# Patient Record
Sex: Male | Born: 1950
Health system: Southern US, Community
[De-identification: ages and names within clinical notes are randomized; demographics above are authoritative.]

## PROBLEM LIST (undated history)

## (undated) DIAGNOSIS — Z720 Tobacco use: Secondary | ICD-10-CM

## (undated) DIAGNOSIS — M1990 Unspecified osteoarthritis, unspecified site: Secondary | ICD-10-CM

## (undated) DIAGNOSIS — K4090 Unilateral inguinal hernia, without obstruction or gangrene, not specified as recurrent: Secondary | ICD-10-CM

## (undated) DIAGNOSIS — K635 Polyp of colon: Secondary | ICD-10-CM

## (undated) DIAGNOSIS — Z6825 Body mass index (BMI) 25.0-25.9, adult: Secondary | ICD-10-CM

## (undated) DIAGNOSIS — F419 Anxiety disorder, unspecified: Secondary | ICD-10-CM

## (undated) DIAGNOSIS — R319 Hematuria, unspecified: Secondary | ICD-10-CM

## (undated) DIAGNOSIS — M51369 Other intervertebral disc degeneration, lumbar region without mention of lumbar back pain or lower extremity pain: Secondary | ICD-10-CM

## (undated) DIAGNOSIS — K529 Noninfective gastroenteritis and colitis, unspecified: Secondary | ICD-10-CM

## (undated) DIAGNOSIS — C679 Malignant neoplasm of bladder, unspecified: Secondary | ICD-10-CM

## (undated) DIAGNOSIS — M545 Low back pain, unspecified: Secondary | ICD-10-CM

## (undated) DIAGNOSIS — M5136 Other intervertebral disc degeneration, lumbar region: Secondary | ICD-10-CM

## (undated) DIAGNOSIS — J309 Allergic rhinitis, unspecified: Secondary | ICD-10-CM

## (undated) HISTORY — DX: Noninfective gastroenteritis and colitis, unspecified: K52.9

## (undated) HISTORY — DX: Low back pain, unspecified: M54.50

## (undated) HISTORY — DX: Tobacco use: Z72.0

## (undated) HISTORY — DX: Anxiety disorder, unspecified: F41.9

## (undated) HISTORY — DX: Body mass index (BMI) 25.0-25.9, adult: Z68.25

## (undated) HISTORY — PX: SPLENIC ARTERY EMBOLIZATION: SHX2430

## (undated) HISTORY — PX: OTHER SURGICAL HISTORY: SHX169

## (undated) HISTORY — DX: Other intervertebral disc degeneration, lumbar region: M51.36

## (undated) HISTORY — DX: Other intervertebral disc degeneration, lumbar region without mention of lumbar back pain or lower extremity pain: M51.369

## (undated) HISTORY — DX: Malignant neoplasm of bladder, unspecified: C67.9

## (undated) HISTORY — DX: Unspecified osteoarthritis, unspecified site: M19.90

## (undated) HISTORY — DX: Allergic rhinitis, unspecified: J30.9

## (undated) HISTORY — DX: Unilateral inguinal hernia, without obstruction or gangrene, not specified as recurrent: K40.90

## (undated) HISTORY — PX: HERNIA REPAIR: SHX51

## (undated) HISTORY — DX: Hematuria, unspecified: R31.9

## (undated) HISTORY — DX: Polyp of colon: K63.5

## (undated) HISTORY — DX: Low back pain: M54.5

---

## 2003-08-18 ENCOUNTER — Encounter: Payer: Self-pay | Admitting: Neurology

## 2003-08-18 ENCOUNTER — Encounter: Admission: RE | Admit: 2003-08-18 | Discharge: 2003-08-18 | Payer: Self-pay | Admitting: Neurology

## 2005-11-13 ENCOUNTER — Ambulatory Visit: Payer: Self-pay | Admitting: Psychiatry

## 2005-11-13 ENCOUNTER — Inpatient Hospital Stay (HOSPITAL_COMMUNITY): Admission: RE | Admit: 2005-11-13 | Discharge: 2005-11-20 | Payer: Self-pay | Admitting: Psychiatry

## 2006-02-17 ENCOUNTER — Ambulatory Visit: Payer: Self-pay | Admitting: Psychiatry

## 2006-02-17 ENCOUNTER — Inpatient Hospital Stay (HOSPITAL_COMMUNITY): Admission: EM | Admit: 2006-02-17 | Discharge: 2006-02-18 | Payer: Self-pay | Admitting: Psychiatry

## 2013-01-13 DIAGNOSIS — G894 Chronic pain syndrome: Secondary | ICD-10-CM | POA: Insufficient documentation

## 2013-01-13 HISTORY — DX: Chronic pain syndrome: G89.4

## 2013-01-15 ENCOUNTER — Encounter: Payer: Self-pay | Admitting: Neurology

## 2013-01-15 DIAGNOSIS — G8929 Other chronic pain: Secondary | ICD-10-CM

## 2013-01-15 HISTORY — DX: Other chronic pain: G89.29

## 2013-02-23 ENCOUNTER — Ambulatory Visit: Payer: Self-pay | Admitting: Neurology

## 2013-03-08 ENCOUNTER — Other Ambulatory Visit: Payer: Self-pay | Admitting: Neurology

## 2013-03-08 ENCOUNTER — Other Ambulatory Visit: Payer: Self-pay

## 2013-03-08 MED ORDER — OXYCODONE HCL 15 MG PO TABS: 15.0000 mg | ORAL_TABLET | Freq: Four times a day (QID) | ORAL | Status: DC | PRN

## 2013-03-08 MED ORDER — METHADONE HCL 10 MG PO TABS: ORAL_TABLET | ORAL | Status: DC

## 2013-03-08 NOTE — Telephone Encounter (Signed)
Patient called to request refills on Methadone and Oxycodone.

## 2013-04-05 ENCOUNTER — Other Ambulatory Visit: Payer: Self-pay

## 2013-04-05 MED ORDER — METHADONE HCL 10 MG PO TABS: ORAL_TABLET | ORAL | Status: DC

## 2013-04-05 MED ORDER — OXYCODONE HCL 15 MG PO TABS: 15.0000 mg | ORAL_TABLET | Freq: Four times a day (QID) | ORAL | Status: DC | PRN

## 2013-04-05 NOTE — Telephone Encounter (Signed)
Patient requesting refills on Oxycodone and Methadone.  He would like to pick up Rx's when they are ready.  Call back number [phone] or [phone].

## 2013-05-04 ENCOUNTER — Other Ambulatory Visit: Payer: Self-pay

## 2013-05-04 MED ORDER — METHADONE HCL 10 MG PO TABS: ORAL_TABLET | ORAL | Status: DC

## 2013-05-04 MED ORDER — OXYCODONE HCL 15 MG PO TABS: 15.0000 mg | ORAL_TABLET | Freq: Four times a day (QID) | ORAL | Status: DC | PRN

## 2013-05-04 NOTE — Telephone Encounter (Signed)
Patient called requesting refills on Methadone and Oxycodone.  Call back number [phone] or [phone]

## 2013-05-04 NOTE — Telephone Encounter (Signed)
Pt called back for his prescription to make sure that we received information on his prescription that he would like to pick up today he has a ride but will not have one this afternoon.

## 2013-05-31 ENCOUNTER — Other Ambulatory Visit: Payer: Self-pay

## 2013-05-31 MED ORDER — OXYCODONE HCL 15 MG PO TABS: 15.0000 mg | ORAL_TABLET | Freq: Four times a day (QID) | ORAL | Status: DC | PRN

## 2013-05-31 MED ORDER — METHADONE HCL 10 MG PO TABS: ORAL_TABLET | ORAL | Status: DC

## 2013-05-31 NOTE — Telephone Encounter (Signed)
Patient called, left message requesting refills on Methadone and Oxycodone.  He would like to pick up the Rx's when they are ready.  Call back number [phone] or [phone].

## 2013-06-28 ENCOUNTER — Other Ambulatory Visit: Payer: Self-pay

## 2013-06-28 MED ORDER — OXYCODONE HCL 15 MG PO TABS: 15.0000 mg | ORAL_TABLET | Freq: Four times a day (QID) | ORAL | Status: DC | PRN

## 2013-06-28 MED ORDER — METHADONE HCL 10 MG PO TABS: ORAL_TABLET | ORAL | Status: DC

## 2013-06-28 NOTE — Telephone Encounter (Signed)
Rx's ready for pick up.  I called patient, he will be in to get prescriptions.

## 2013-06-28 NOTE — Telephone Encounter (Signed)
Patient called requesting refills on Methadone and Oxycodone.  He would like to pick up the Rx's when they are ready.  Call back number [phone] or [phone].

## 2013-07-27 ENCOUNTER — Other Ambulatory Visit: Payer: Self-pay

## 2013-07-27 MED ORDER — OXYCODONE HCL 15 MG PO TABS: 15.0000 mg | ORAL_TABLET | Freq: Four times a day (QID) | ORAL | Status: DC | PRN

## 2013-07-27 MED ORDER — METHADONE HCL 10 MG PO TABS: ORAL_TABLET | ORAL | Status: DC

## 2013-07-27 NOTE — Telephone Encounter (Signed)
Patient called requesting a refill on Methadone and Oxycodone.  He would like to pick up the Rx's when they are ready.  Call back number [phone] or [phone].

## 2013-07-27 NOTE — Telephone Encounter (Signed)
Rx's have been signed, and are ready for pick up.  I called the patient.  He is aware.

## 2013-08-23 ENCOUNTER — Other Ambulatory Visit: Payer: Self-pay

## 2013-08-23 MED ORDER — METHADONE HCL 10 MG PO TABS: ORAL_TABLET | ORAL | Status: DC

## 2013-08-23 MED ORDER — OXYCODONE HCL 15 MG PO TABS: 15.0000 mg | ORAL_TABLET | Freq: Four times a day (QID) | ORAL | Status: DC | PRN

## 2013-08-23 NOTE — Telephone Encounter (Signed)
Patient called requesting refills on Methadone and Oxycodone.  He would like to pick up the Rx's when they're ready.  Call back number [phone] or [phone].

## 2013-09-21 ENCOUNTER — Other Ambulatory Visit: Payer: Self-pay | Admitting: Neurology

## 2013-09-21 MED ORDER — METHADONE HCL 10 MG PO TABS: ORAL_TABLET | ORAL | Status: DC

## 2013-09-21 MED ORDER — OXYCODONE HCL 15 MG PO TABS: 15.0000 mg | ORAL_TABLET | Freq: Four times a day (QID) | ORAL | Status: DC | PRN

## 2013-10-19 ENCOUNTER — Other Ambulatory Visit: Payer: Self-pay

## 2013-10-19 MED ORDER — METHADONE HCL 10 MG PO TABS: ORAL_TABLET | ORAL | Status: DC

## 2013-10-19 MED ORDER — OXYCODONE HCL 15 MG PO TABS: 15.0000 mg | ORAL_TABLET | Freq: Four times a day (QID) | ORAL | Status: DC | PRN

## 2013-10-19 NOTE — Telephone Encounter (Signed)
I called [facility] and notified his with that patient's two Rx are available for pick up at front desk.

## 2013-10-19 NOTE — Telephone Encounter (Signed)
Patient called requesting a refill on Methadone and Oxycodone.  He would like to pick up the Rx's when they are ready.  Call back number [phone].

## 2013-11-15 ENCOUNTER — Other Ambulatory Visit: Payer: Self-pay

## 2013-11-15 MED ORDER — METHADONE HCL 10 MG PO TABS: ORAL_TABLET | ORAL | Status: DC

## 2013-11-15 MED ORDER — OXYCODONE HCL 15 MG PO TABS: 15.0000 mg | ORAL_TABLET | Freq: Four times a day (QID) | ORAL | Status: DC | PRN

## 2013-11-15 NOTE — Telephone Encounter (Signed)
Patient called requesting refills on Methadone and Oxycodone.  He would like to pick up the Rx's when they are ready.  Call back number [phone].

## 2013-11-16 NOTE — Telephone Encounter (Signed)
Called patient to inform him that his prescription was ready to be picked up and if he has any other problems, questions or concerns to call the office. Patient verbalized understanding. °

## 2013-12-13 ENCOUNTER — Other Ambulatory Visit: Payer: Self-pay

## 2013-12-13 MED ORDER — OXYCODONE HCL 15 MG PO TABS: 15.0000 mg | ORAL_TABLET | Freq: Four times a day (QID) | ORAL | Status: DC | PRN

## 2013-12-13 MED ORDER — METHADONE HCL 10 MG PO TABS: ORAL_TABLET | ORAL | Status: DC

## 2013-12-13 NOTE — Telephone Encounter (Signed)
Patient called requesting a refill on Methadone and Oxycodone.  He would lile to pick up the Rx's when they are ready.  Call back number [phone]

## 2013-12-13 NOTE — Telephone Encounter (Signed)
I called and let patient know both Rx's are ready for pick up.

## 2014-01-10 ENCOUNTER — Other Ambulatory Visit: Payer: Self-pay | Admitting: Neurology

## 2014-01-10 MED ORDER — OXYCODONE HCL 15 MG PO TABS: 15.0000 mg | ORAL_TABLET | Freq: Four times a day (QID) | ORAL | Status: DC | PRN

## 2014-01-10 MED ORDER — METHADONE HCL 10 MG PO TABS: ORAL_TABLET | ORAL | Status: DC

## 2014-01-10 NOTE — Telephone Encounter (Signed)
Needs Methidone and oxycodone for pickup. Wants to know if he can pick it up today pending the bad weather for tomorrow.

## 2014-01-10 NOTE — Telephone Encounter (Signed)
Called patient to inform him that his Rx was ready to be picked up at the front desk and if he has any other problems, questions or concerns to call the office. Patient verbalized understanding.

## 2014-02-07 ENCOUNTER — Telehealth: Payer: Self-pay | Admitting: Neurology

## 2014-02-07 MED ORDER — METHADONE HCL 10 MG PO TABS: ORAL_TABLET | ORAL | Status: DC

## 2014-02-07 MED ORDER — OXYCODONE HCL 15 MG PO TABS: 15.0000 mg | ORAL_TABLET | Freq: Four times a day (QID) | ORAL | Status: DC | PRN

## 2014-02-07 NOTE — Telephone Encounter (Signed)
Patient needs written Rx's for Methadone 10mg and Oxycodone 15mg--please call patient when ready for pick up--thank you. °

## 2014-02-07 NOTE — Telephone Encounter (Signed)
Patient has appt 03/20

## 2014-02-07 NOTE — Telephone Encounter (Signed)
I'll write the prescriptions for the pain medications.

## 2014-02-08 NOTE — Telephone Encounter (Signed)
Patient was notified that his prescriptions were ready for pickup at the front desk.  Patient was advised to call the office with any questions or concerns.

## 2014-02-11 ENCOUNTER — Encounter: Payer: Self-pay | Admitting: Neurology

## 2014-02-11 ENCOUNTER — Encounter (INDEPENDENT_AMBULATORY_CARE_PROVIDER_SITE_OTHER): Payer: Self-pay

## 2014-02-11 ENCOUNTER — Ambulatory Visit (INDEPENDENT_AMBULATORY_CARE_PROVIDER_SITE_OTHER): Payer: Self-pay | Admitting: Neurology

## 2014-02-11 VITALS — BP 160/91 | HR 87 | Wt 166.0 lb

## 2014-02-11 DIAGNOSIS — M549 Dorsalgia, unspecified: Secondary | ICD-10-CM

## 2014-02-11 DIAGNOSIS — G894 Chronic pain syndrome: Secondary | ICD-10-CM

## 2014-02-11 DIAGNOSIS — G8929 Other chronic pain: Secondary | ICD-10-CM

## 2014-02-11 MED ORDER — FLUOXETINE HCL (PMDD) 10 MG PO TABS: 10.0000 mg | ORAL_TABLET | Freq: Every day | ORAL | Status: DC

## 2014-02-11 NOTE — Patient Instructions (Signed)
Back Pain, Adult Low back pain is very common. About 1 in 5 people have back pain.The cause of low back pain is rarely dangerous. The pain often gets better over time.About half of people with a sudden onset of back pain feel better in just 2 weeks. About 8 in 10 people feel better by 6 weeks.  CAUSES Some common causes of back pain include:  Strain of the muscles or ligaments supporting the spine.  Wear and tear (degeneration) of the spinal discs.  Arthritis.  Direct injury to the back. DIAGNOSIS Most of the time, the direct cause of low back pain is not known.However, back pain can be treated effectively even when the exact cause of the pain is unknown.Answering your caregiver's questions about your overall health and symptoms is one of the most accurate ways to make sure the cause of your pain is not dangerous. If your caregiver needs more information, he or she may order lab work or imaging tests (X-rays or MRIs).However, even if imaging tests show changes in your back, this usually does not require surgery. HOME CARE INSTRUCTIONS For many people, back pain returns.Since low back pain is rarely dangerous, it is often a condition that people can learn to manageon their own.   Remain active. It is stressful on the back to sit or stand in one place. Do not sit, drive, or stand in one place for more than 30 minutes at a time. Take short walks on level surfaces as soon as pain allows.Try to increase the length of time you walk each day.  Do not stay in bed.Resting more than 1 or 2 days can delay your recovery.  Do not avoid exercise or work.Your body is made to move.It is not dangerous to be active, even though your back may hurt.Your back will likely heal faster if you return to being active before your pain is gone.  Pay attention to your body when you bend and lift. Many people have less discomfortwhen lifting if they bend their knees, keep the load close to their bodies,and  avoid twisting. Often, the most comfortable positions are those that put less stress on your recovering back.  Find a comfortable position to sleep. Use a firm mattress and lie on your side with your knees slightly bent. If you lie on your back, put a pillow under your knees.  Only take over-the-counter or prescription medicines as directed by your caregiver. Over-the-counter medicines to reduce pain and inflammation are often the most helpful.Your caregiver may prescribe muscle relaxant drugs.These medicines help dull your pain so you can more quickly return to your normal activities and healthy exercise.  Put ice on the injured area.  Put ice in a plastic bag.  Place a towel between your skin and the bag.  Leave the ice on for 15-20 minutes, 03-04 times a day for the first 2 to 3 days. After that, ice and heat may be alternated to reduce pain and spasms.  Ask your caregiver about trying back exercises and gentle massage. This may be of some benefit.  Avoid feeling anxious or stressed.Stress increases muscle tension and can worsen back pain.It is important to recognize when you are anxious or stressed and learn ways to manage it.Exercise is a great option. SEEK MEDICAL CARE IF:  You have pain that is not relieved with rest or medicine.  You have pain that does not improve in 1 week.  You have new symptoms.  You are generally not feeling well. SEEK   IMMEDIATE MEDICAL CARE IF:   You have pain that radiates from your back into your legs.  You develop new bowel or bladder control problems.  You have unusual weakness or numbness in your arms or legs.  You develop nausea or vomiting.  You develop abdominal pain.  You feel faint. Document Released: 11/11/2005 Document Revised: 05/12/2012 Document Reviewed: 04/01/2011 [employer] Patient Information 2014 [employer].  

## 2014-02-11 NOTE — Progress Notes (Signed)
Reason for visit: Low back pain  Randy Chase is an 63 y.o. male  History of present illness:  Randy Chase is a 63 year old right-handed white male with a history of chronic low back pain. The patient indicates that he has not been able to hold a job, and any physical activity significantly exacerbates his low back pain. The patient is having some issues with anxiety and panic attacks. The patient will use some of his wife's diazepam at times. The patient is considering retiring this summer when he turns 21. The patient comes to this office for further evaluation. The patient remains on methadone and oxycodone. The patient takes this regularly. The patient has a history of bladder cancer in the past, and he recently had some hematuria.  Past Medical History  Diagnosis Date  . Low back pain   . Colitis   . Arthritis   . Bladder cancer   . Anxiety     Past Surgical History  Procedure Laterality Date  . Bladder cancer    . Splenic artery embolization      Splenic resection  . Hernia repair      Right inguinal    Family History  Problem Relation Age of Onset  . Colon cancer Mother     Social history:  reports that he has been smoking Cigarettes.  He has been smoking about 0.00 packs per day. He has never used smokeless tobacco. He reports that he drinks about 4.0 ounces of alcohol per week. He reports that he does not use illicit drugs.    Allergies  Allergen Reactions  . Reglan [Metoclopramide]     Medications:  Current Outpatient Prescriptions on File Prior to Visit  Medication Sig Dispense Refill  . ibuprofen (ADVIL,MOTRIN) 200 MG tablet Take 200 mg by mouth 2 (two) times daily as needed for pain.      Marland Kitchen loratadine (CLARITIN) 10 MG tablet Take 10 mg by mouth daily.      . methadone (DOLOPHINE) 10 MG tablet Take 2 tablets in the morning and 3 tablets in the evening  150 tablet  0  . oxyCODONE (ROXICODONE) 15 MG immediate release tablet Take 1 tablet (15 mg total) by  mouth every 6 (six) hours as needed.  120 tablet  0   No current facility-administered medications on file prior to visit.    ROS:  Out of a complete 14 system review of symptoms, the patient complains only of the following symptoms, and all other reviewed systems are negative.  Fatigue Loss of vision Shortness of breath Cold intolerance Frequent waking Environmental allergies Blood in the urine Joint pain, joint swelling, back pain, coordination problems  Bruising easily Memory loss Depression, anxiety  Blood pressure 160/91, pulse 87, weight 166 lb (75.297 kg).  Physical Exam  General: The patient is alert and cooperative at the time of the examination.  Skin: No significant peripheral edema is noted.   Neurologic Exam  Mental status: The patient is oriented x 3.  Cranial nerves: Facial symmetry is present. Speech is normal, no aphasia or dysarthria is noted. Extraocular movements are full. Visual fields are full.  Motor: The patient has good strength in all 4 extremities.  Sensory examination: Soft touch sensation on the face, arms, and legs is symmetric.  Coordination: The patient has good finger-nose-finger and heel-to-shin bilaterally.  Gait and station: The patient has a normal gait. Tandem gait is normal. Romberg is negative. No drift is seen.  Reflexes: Deep tendon reflexes are  symmetric.   Assessment/Plan:  1. Chronic low back pain  2. Anxiety, panic disorder  The patient will be started on Prozac today. The patient will contact me if he is still having issues, and the dose can be increased. The patient will continue on his opiate medications. The patient will followup in one year.  Jill Alexanders MD 02/11/2014 6:41 PM  [facility] [address] [place], [place] [address]  Phone [phone] Fax [phone]

## 2014-03-07 ENCOUNTER — Other Ambulatory Visit: Payer: Self-pay | Admitting: Neurology

## 2014-03-07 MED ORDER — OXYCODONE HCL 15 MG PO TABS: 15.0000 mg | ORAL_TABLET | Freq: Four times a day (QID) | ORAL | Status: DC | PRN

## 2014-03-07 MED ORDER — METHADONE HCL 10 MG PO TABS: ORAL_TABLET | ORAL | Status: DC

## 2014-03-07 NOTE — Telephone Encounter (Signed)
Left a message that the prescriptions are ready for pickup at the front desk.

## 2014-03-07 NOTE — Telephone Encounter (Signed)
Patient needs written Rx for Methadone and Oxycodone-please call patient when ready for pickup--thank you.

## 2014-04-04 ENCOUNTER — Other Ambulatory Visit: Payer: Self-pay | Admitting: *Deleted

## 2014-04-04 MED ORDER — METHADONE HCL 10 MG PO TABS: ORAL_TABLET | ORAL | Status: DC

## 2014-04-04 MED ORDER — OXYCODONE HCL 15 MG PO TABS: 15.0000 mg | ORAL_TABLET | Freq: Four times a day (QID) | ORAL | Status: DC | PRN

## 2014-04-04 NOTE — Telephone Encounter (Signed)
Request forwarded to the provider for approval.

## 2014-04-04 NOTE — Telephone Encounter (Signed)
Patient calling needing refills for methadone (DOLOPHINE) 10 MG tablet and oxyCODONE (ROXICODONE) 15 MG immediate release tablet.  Please call when ready for pickup.  Thanks

## 2014-04-04 NOTE — Telephone Encounter (Signed)
Patient was notified that his prescriptions are ready for pick-up at the front desk.   Patient verbalized understanding.

## 2014-05-02 ENCOUNTER — Telehealth: Payer: Self-pay | Admitting: Neurology

## 2014-05-02 MED ORDER — METHADONE HCL 10 MG PO TABS: ORAL_TABLET | ORAL | Status: DC

## 2014-05-02 MED ORDER — OXYCODONE HCL 15 MG PO TABS: 15.0000 mg | ORAL_TABLET | Freq: Four times a day (QID) | ORAL | Status: DC | PRN

## 2014-05-02 NOTE — Telephone Encounter (Signed)
Patient needs written Rx's for Methadone 10mg  and Oxycodone 15mg --please call patient when ready for pick up--thank you.

## 2014-05-02 NOTE — Telephone Encounter (Signed)
Pt requesting refills on Methadone and oxycodone. Please advise

## 2014-05-02 NOTE — Telephone Encounter (Signed)
Refills will be given for the methadone and oxycodone.

## 2014-05-03 NOTE — Telephone Encounter (Signed)
Called pt to inform him that his Rx was ready to be picked up at the front desk and if he has any other problems, questions or concerns to call the office. Pt verbalized understanding. °

## 2014-05-30 ENCOUNTER — Other Ambulatory Visit: Payer: Self-pay | Admitting: Neurology

## 2014-05-30 MED ORDER — OXYCODONE HCL 15 MG PO TABS: 15.0000 mg | ORAL_TABLET | Freq: Four times a day (QID) | ORAL | Status: DC | PRN

## 2014-05-30 MED ORDER — METHADONE HCL 10 MG PO TABS: ORAL_TABLET | ORAL | Status: DC

## 2014-05-30 NOTE — Telephone Encounter (Signed)
Patient calling to get a written Rx for Methadone 10mg  and Oxycodone 15mg --please call patient when ready for pickup--thank you.

## 2014-05-30 NOTE — Telephone Encounter (Signed)
Dr Willis is out of the office.  Forwarding request to [facility] for approval.  

## 2014-05-31 ENCOUNTER — Other Ambulatory Visit: Payer: Self-pay | Admitting: Neurology

## 2014-05-31 MED ORDER — METHADONE HCL 10 MG PO TABS: ORAL_TABLET | ORAL | Status: DC

## 2014-05-31 MED ORDER — OXYCODONE HCL 15 MG PO TABS: 15.0000 mg | ORAL_TABLET | Freq: Four times a day (QID) | ORAL | Status: DC | PRN

## 2014-05-31 NOTE — Telephone Encounter (Signed)
Pt came by the office to picked up Rx from the front desk today.

## 2014-05-31 NOTE — Telephone Encounter (Signed)
Patient requesting refill of methadone and oxycodone, states that he only has one left for tonight.

## 2014-06-27 ENCOUNTER — Telehealth: Payer: Self-pay | Admitting: Neurology

## 2014-06-27 NOTE — Telephone Encounter (Signed)
Rx's were last written on 07/08 for a 30 day supply on each.  I called the patient back.  Got no answer, left message.

## 2014-06-27 NOTE — Telephone Encounter (Signed)
Calling for refills on methadone (DOLOPHINE) 10 MG tablet and oxyCODONE (ROXICODONE) 15 MG immediate release tablet please call when it is ready for pickup

## 2014-06-28 ENCOUNTER — Other Ambulatory Visit: Payer: Self-pay

## 2014-06-28 MED ORDER — OXYCODONE HCL 15 MG PO TABS: 15.0000 mg | ORAL_TABLET | Freq: Four times a day (QID) | ORAL | Status: DC | PRN

## 2014-06-28 MED ORDER — METHADONE HCL 10 MG PO TABS: ORAL_TABLET | ORAL | Status: DC

## 2014-06-28 NOTE — Telephone Encounter (Signed)
Pt stopped by the office to pick up his Rx. °

## 2014-06-28 NOTE — Telephone Encounter (Signed)
Patient calling to follow up on the status of these refills, patient requesting a call back, please reach him at [phone].

## 2014-07-25 ENCOUNTER — Telehealth: Payer: Self-pay | Admitting: Neurology

## 2014-07-25 MED ORDER — OXYCODONE HCL 15 MG PO TABS: 15.0000 mg | ORAL_TABLET | Freq: Four times a day (QID) | ORAL | Status: DC | PRN

## 2014-07-25 MED ORDER — METHADONE HCL 10 MG PO TABS: ORAL_TABLET | ORAL | Status: DC

## 2014-07-25 NOTE — Telephone Encounter (Signed)
I will refill the medications. 

## 2014-07-25 NOTE — Telephone Encounter (Signed)
Patient calling for refills on Methadone 10mg  and Oxycodone 15mg  immediate release.

## 2014-07-26 NOTE — Telephone Encounter (Signed)
Called pt to inform him that his Rx was ready to be picked up at the front desk and if he has any other problems, questions or concerns to call the office. Pt verbalized understanding. °

## 2014-08-22 ENCOUNTER — Other Ambulatory Visit: Payer: Self-pay | Admitting: Neurology

## 2014-08-22 MED ORDER — METHADONE HCL 10 MG PO TABS: ORAL_TABLET | ORAL | Status: DC

## 2014-08-22 MED ORDER — OXYCODONE HCL 15 MG PO TABS: 15.0000 mg | ORAL_TABLET | Freq: Four times a day (QID) | ORAL | Status: DC | PRN

## 2014-08-22 NOTE — Telephone Encounter (Signed)
Patient requesting refill of methadone and oxycodone, please call and advise.

## 2014-08-22 NOTE — Telephone Encounter (Signed)
Dr Willis is out of the office, forwarding request to [facility] for review.   

## 2014-08-23 NOTE — Telephone Encounter (Signed)
Called pt to inform him that his Rx was ready to be picked up at the front desk and if he has any other problems, questions or concerns to call the office. Pt verbalized understanding. °

## 2014-09-19 ENCOUNTER — Other Ambulatory Visit: Payer: Self-pay | Admitting: Neurology

## 2014-09-19 MED ORDER — METHADONE HCL 10 MG PO TABS: ORAL_TABLET | ORAL | Status: DC

## 2014-09-19 MED ORDER — OXYCODONE HCL 15 MG PO TABS: 15.0000 mg | ORAL_TABLET | Freq: Four times a day (QID) | ORAL | Status: DC | PRN

## 2014-09-19 NOTE — Telephone Encounter (Signed)
Patient requesting refill of methadone and oxycodone, please call when ready for pick up.

## 2014-09-19 NOTE — Telephone Encounter (Signed)
Dr Jannifer Franklin is out of the office.  Request entered, forwarded to [facility] for review.  Last written 09/28.

## 2014-09-20 NOTE — Telephone Encounter (Signed)
Patient calling to check on the status of his refill, states that today is the only day he can come and pick it up, please return call and advise.

## 2014-09-20 NOTE — Telephone Encounter (Signed)
I called the patient back.  He is aware we will call him when Rx's are ready.

## 2014-09-22 NOTE — Telephone Encounter (Signed)
Patient picked up Rx on 09/20/14

## 2014-10-12 ENCOUNTER — Encounter: Payer: Self-pay | Admitting: Neurology

## 2014-10-17 ENCOUNTER — Other Ambulatory Visit: Payer: Self-pay | Admitting: Neurology

## 2014-10-17 MED ORDER — METHADONE HCL 10 MG PO TABS: ORAL_TABLET | ORAL | Status: DC

## 2014-10-17 MED ORDER — OXYCODONE HCL 15 MG PO TABS: 15.0000 mg | ORAL_TABLET | Freq: Four times a day (QID) | ORAL | Status: DC | PRN

## 2014-10-17 NOTE — Telephone Encounter (Signed)
Pt is calling to request a written Rx for  methadone (DOLOPHINE) 10 MG tablet and oxyCODONE (ROXICODONE) 15 MG immediate release tablet. Please call when ready to pick up.

## 2014-10-17 NOTE — Telephone Encounter (Signed)
Request entered, forwarded to provider for approval.  

## 2014-10-18 ENCOUNTER — Encounter: Payer: Self-pay | Admitting: Neurology

## 2014-10-18 NOTE — Telephone Encounter (Signed)
Patient calling to state that he would like to pick up his script today since today is the only time he can come and get it, please call and advise.

## 2014-10-18 NOTE — Telephone Encounter (Signed)
Patient came to the office to see if Rx was ready. I took signed Rx to the front desk

## 2014-11-14 ENCOUNTER — Other Ambulatory Visit: Payer: Self-pay | Admitting: Neurology

## 2014-11-14 NOTE — Telephone Encounter (Signed)
Patient requesting Rx refill for oxyCODONE (ROXICODONE) 15 MG immediate release tablet and methadone (DOLOPHINE) 10 MG tablet.  Please call when ready for pick up.

## 2014-11-14 NOTE — Telephone Encounter (Signed)
Dr Jannifer Franklin is out of the office.  Request entered, forwarded to [facility] for approval.

## 2014-11-15 ENCOUNTER — Telehealth: Payer: Self-pay | Admitting: Neurology

## 2014-11-15 MED ORDER — OXYCODONE HCL 15 MG PO TABS: 15.0000 mg | ORAL_TABLET | Freq: Four times a day (QID) | ORAL | Status: DC | PRN

## 2014-11-15 MED ORDER — METHADONE HCL 10 MG PO TABS: ORAL_TABLET | ORAL | Status: DC

## 2014-11-15 NOTE — Telephone Encounter (Signed)
Patient came to pick up medications for methadone and oxycodone.

## 2014-12-12 ENCOUNTER — Telehealth: Payer: Self-pay

## 2014-12-12 ENCOUNTER — Other Ambulatory Visit: Payer: Self-pay | Admitting: Neurology

## 2014-12-12 MED ORDER — OXYCODONE HCL 15 MG PO TABS: 15.0000 mg | ORAL_TABLET | Freq: Four times a day (QID) | ORAL | Status: DC | PRN

## 2014-12-12 MED ORDER — METHADONE HCL 10 MG PO TABS: ORAL_TABLET | ORAL | Status: DC

## 2014-12-12 NOTE — Telephone Encounter (Signed)
Request entered, forwarded to provider for approval.  

## 2014-12-12 NOTE — Telephone Encounter (Signed)
Patient is calling for written Rx for Methadone 10 mg and Oxycodone 15 mg.  Please call.

## 2014-12-12 NOTE — Telephone Encounter (Signed)
Called patient and informed Rx ready for pick up at front desk. Patient verbalized understanding.  

## 2015-01-10 ENCOUNTER — Telehealth: Payer: Self-pay

## 2015-01-10 ENCOUNTER — Other Ambulatory Visit: Payer: Self-pay | Admitting: Neurology

## 2015-01-10 MED ORDER — METHADONE HCL 10 MG PO TABS: ORAL_TABLET | ORAL | Status: DC

## 2015-01-10 MED ORDER — OXYCODONE HCL 15 MG PO TABS: 15.0000 mg | ORAL_TABLET | Freq: Four times a day (QID) | ORAL | Status: DC | PRN

## 2015-01-10 NOTE — Telephone Encounter (Signed)
Called patient and informed Rx ready for pick up at front desk. Patient verbalized understanding.  

## 2015-01-10 NOTE — Telephone Encounter (Signed)
Request entered, forwarded to provider for approval.  

## 2015-01-10 NOTE — Telephone Encounter (Signed)
Pt is calling to get written Rx for methadone (DOLOPHINE) 10 MG tablet and oxyCODONE (ROXICODONE) 15 MG immediate release tablet. Please call when ready for pick up.

## 2015-02-06 ENCOUNTER — Telehealth: Payer: Self-pay

## 2015-02-06 ENCOUNTER — Other Ambulatory Visit: Payer: Self-pay | Admitting: Neurology

## 2015-02-06 MED ORDER — METHADONE HCL 10 MG PO TABS
ORAL_TABLET | ORAL | Status: DC
Start: 2015-02-06 — End: 2015-03-06

## 2015-02-06 MED ORDER — OXYCODONE HCL 15 MG PO TABS: 15.0000 mg | ORAL_TABLET | Freq: Four times a day (QID) | ORAL | Status: DC | PRN

## 2015-02-06 NOTE — Telephone Encounter (Signed)
Request entered, forwarded to provider for approval.    Patient has annual appt scheduled.

## 2015-02-06 NOTE — Telephone Encounter (Signed)
Called patient and informed Rx ready for pick up at front desk. Patient verbalized understanding.  

## 2015-02-06 NOTE — Telephone Encounter (Signed)
Patient is calling to get written Rx for Methadone 10 mg and Oxycodone 15 mg.  Please call when ready.  Thanks!

## 2015-02-13 ENCOUNTER — Ambulatory Visit (INDEPENDENT_AMBULATORY_CARE_PROVIDER_SITE_OTHER): Payer: Self-pay | Admitting: Neurology

## 2015-02-13 ENCOUNTER — Encounter: Payer: Self-pay | Admitting: Neurology

## 2015-02-13 VITALS — BP 164/91 | HR 81 | Ht 69.0 in | Wt 164.6 lb

## 2015-02-13 DIAGNOSIS — M549 Dorsalgia, unspecified: Secondary | ICD-10-CM

## 2015-02-13 DIAGNOSIS — G8929 Other chronic pain: Secondary | ICD-10-CM

## 2015-02-13 DIAGNOSIS — G894 Chronic pain syndrome: Secondary | ICD-10-CM

## 2015-02-13 MED ORDER — FLUOXETINE HCL (PMDD) 10 MG PO TABS: 10.0000 mg | ORAL_TABLET | Freq: Every day | ORAL | Status: DC

## 2015-02-13 NOTE — Patient Instructions (Signed)
Back Pain, Adult Low back pain is very common. About 1 in 5 people have back pain.The cause of low back pain is rarely dangerous. The pain often gets better over time.About half of people with a sudden onset of back pain feel better in just 2 weeks. About 8 in 10 people feel better by 6 weeks.  CAUSES Some common causes of back pain include:  Strain of the muscles or ligaments supporting the spine.  Wear and tear (degeneration) of the spinal discs.  Arthritis.  Direct injury to the back. DIAGNOSIS Most of the time, the direct cause of low back pain is not known.However, back pain can be treated effectively even when the exact cause of the pain is unknown.Answering your caregiver's questions about your overall health and symptoms is one of the most accurate ways to make sure the cause of your pain is not dangerous. If your caregiver needs more information, he or she may order lab work or imaging tests (X-rays or MRIs).However, even if imaging tests show changes in your back, this usually does not require surgery. HOME CARE INSTRUCTIONS For many people, back pain returns.Since low back pain is rarely dangerous, it is often a condition that people can learn to manageon their own.   Remain active. It is stressful on the back to sit or stand in one place. Do not sit, drive, or stand in one place for more than 30 minutes at a time. Take short walks on level surfaces as soon as pain allows.Try to increase the length of time you walk each day.  Do not stay in bed.Resting more than 1 or 2 days can delay your recovery.  Do not avoid exercise or work.Your body is made to move.It is not dangerous to be active, even though your back may hurt.Your back will likely heal faster if you return to being active before your pain is gone.  Pay attention to your body when you bend and lift. Many people have less discomfortwhen lifting if they bend their knees, keep the load close to their bodies,and  avoid twisting. Often, the most comfortable positions are those that put less stress on your recovering back.  Find a comfortable position to sleep. Use a firm mattress and lie on your side with your knees slightly bent. If you lie on your back, put a pillow under your knees.  Only take over-the-counter or prescription medicines as directed by your caregiver. Over-the-counter medicines to reduce pain and inflammation are often the most helpful.Your caregiver may prescribe muscle relaxant drugs.These medicines help dull your pain so you can more quickly return to your normal activities and healthy exercise.  Put ice on the injured area.  Put ice in a plastic bag.  Place a towel between your skin and the bag.  Leave the ice on for 15-20 minutes, 03-04 times a day for the first 2 to 3 days. After that, ice and heat may be alternated to reduce pain and spasms.  Ask your caregiver about trying back exercises and gentle massage. This may be of some benefit.  Avoid feeling anxious or stressed.Stress increases muscle tension and can worsen back pain.It is important to recognize when you are anxious or stressed and learn ways to manage it.Exercise is a great option. SEEK MEDICAL CARE IF:  You have pain that is not relieved with rest or medicine.  You have pain that does not improve in 1 week.  You have new symptoms.  You are generally not feeling well. SEEK   IMMEDIATE MEDICAL CARE IF:   You have pain that radiates from your back into your legs.  You develop new bowel or bladder control problems.  You have unusual weakness or numbness in your arms or legs.  You develop nausea or vomiting.  You develop abdominal pain.  You feel faint. Document Released: 11/11/2005 Document Revised: 05/12/2012 Document Reviewed: 03/15/2014 [employer] Patient Information 2015 [employer]. This information is not intended to replace advice given to you by your health care provider. Make sure you  discuss any questions you have with your health care provider.  

## 2015-02-13 NOTE — Progress Notes (Signed)
Reason for visit: Chronic pain syndrome  Randy Chase is an 64 y.o. male  History of present illness:  Randy Chase is a 64 year old right-handed white male with a history of chronic back pain and generalized arthritic pains. The patient indicates that his pain generally will increase with cold weather. He has some swelling and pain in the right foot from a prior injury to the foot. He remains on methadone on a regular basis. He has not yet gotten his prescription for Prozac. He believes that the medication does help him. He returns for an evaluation. He denies any new medical issues that have come up since last seen. He is under increased stress as his daughter and her son have moved into their home.  Past Medical History  Diagnosis Date  . Low back pain   . Colitis   . Arthritis   . Bladder cancer   . Anxiety     Past Surgical History  Procedure Laterality Date  . Bladder cancer    . Splenic artery embolization      Splenic resection  . Hernia repair      Right inguinal    Family History  Problem Relation Age of Onset  . Colon cancer Mother     Social history:  reports that he has been smoking Cigarettes.  He has never used smokeless tobacco. He reports that he drinks about 16.8 oz of alcohol per week. He reports that he does not use illicit drugs.    Allergies  Allergen Reactions  . Reglan [Metoclopramide]     Medications:  Prior to Admission medications   Medication Sig Start Date End Date Taking? Authorizing Provider  ibuprofen (ADVIL,MOTRIN) 200 MG tablet Take 200 mg by mouth 2 (two) times daily as needed for pain.   Yes Historical Provider, MD  loratadine (CLARITIN) 10 MG tablet Take 10 mg by mouth daily.   Yes Historical Provider, MD  methadone (DOLOPHINE) 10 MG tablet Take 2 tablets in the morning and 3 tablets in the evening 02/06/15  Yes Kathrynn Ducking, MD  oxyCODONE (ROXICODONE) 15 MG immediate release tablet Take 1 tablet (15 mg total) by mouth every 6  (six) hours as needed. 02/06/15  Yes Kathrynn Ducking, MD  Fluoxetine HCl, PMDD, 10 MG TABS Take 1 tablet (10 mg total) by mouth daily. 02/13/15   Kathrynn Ducking, MD    ROS:  Out of a complete 14 system review of symptoms, the patient complains only of the following symptoms, and all other reviewed systems are negative.  Shortness of breath Frequency of urination Joint pain, back pain Bruising easily Depression, anxiety  Blood pressure 164/91, pulse 81, height 5\' 9"  (1.753 m), weight 164 lb 9.6 oz (74.662 kg).  Physical Exam  General: The patient is alert and cooperative at the time of the examination.  Skin: No significant peripheral edema is noted.   Neurologic Exam  Mental status: The patient is alert and oriented x 3 at the time of the examination. The patient has apparent normal recent and remote memory, with an apparently normal attention span and concentration ability.   Cranial nerves: Facial symmetry is present. Speech is normal, no aphasia or dysarthria is noted. Extraocular movements are full. Visual fields are full.  Motor: The patient has good strength in all 4 extremities.  Sensory examination: Soft touch sensation is symmetric on the face, arms, and legs.  Coordination: The patient has good finger-nose-finger and heel-to-shin bilaterally.  Gait and station: The  patient has a normal gait. Tandem gait is normal. Romberg is negative. No drift is seen.  Reflexes: Deep tendon reflexes are symmetric.   Assessment/Plan:  1. Chronic pain syndrome, chronic back pain  2. Anxiety and depression  The patient was given a prescription for Prozac today. He has signed a narcotics agreement. We will continue the methadone for now, he will follow-up in 6 months.  Jill Alexanders MD 02/13/2015 6:58 PM  [facility] [address] [place], [place] [address]  Phone [phone] Fax [phone]

## 2015-03-06 ENCOUNTER — Other Ambulatory Visit: Payer: Self-pay | Admitting: Neurology

## 2015-03-06 ENCOUNTER — Telehealth: Payer: Self-pay

## 2015-03-06 MED ORDER — OXYCODONE HCL 15 MG PO TABS: 15.0000 mg | ORAL_TABLET | Freq: Four times a day (QID) | ORAL | Status: DC | PRN

## 2015-03-06 MED ORDER — METHADONE HCL 10 MG PO TABS: ORAL_TABLET | ORAL | Status: DC

## 2015-03-06 NOTE — Telephone Encounter (Signed)
Request entered, forwarded to provider for approval.  

## 2015-03-06 NOTE — Telephone Encounter (Signed)
Pt is calling requesting written Rx for methadone (DOLOPHINE) 10 MG tablet and oxyCODONE (ROXICODONE) 15 MG immediate release tablet. Please call when ready for pick up.

## 2015-03-06 NOTE — Telephone Encounter (Signed)
Called patient and informed Rx ready for pick up at front desk. Patient verbalized understanding.  

## 2015-04-03 ENCOUNTER — Telehealth: Payer: Self-pay

## 2015-04-03 ENCOUNTER — Other Ambulatory Visit: Payer: Self-pay | Admitting: Neurology

## 2015-04-03 MED ORDER — METHADONE HCL 10 MG PO TABS: ORAL_TABLET | ORAL | Status: DC

## 2015-04-03 MED ORDER — OXYCODONE HCL 15 MG PO TABS: 15.0000 mg | ORAL_TABLET | Freq: Four times a day (QID) | ORAL | Status: DC | PRN

## 2015-04-03 NOTE — Telephone Encounter (Signed)
Rx ready for pick up. 

## 2015-04-03 NOTE — Telephone Encounter (Signed)
Request entered, forwarded to provider for approval.  

## 2015-04-03 NOTE — Telephone Encounter (Signed)
Patient is calling for 2 written Rx:  Methadone 10 mg; Oxycodone 15 mg.  Please call when ready.  Thanks!

## 2015-05-01 ENCOUNTER — Telehealth: Payer: Self-pay

## 2015-05-01 ENCOUNTER — Other Ambulatory Visit: Payer: Self-pay | Admitting: Neurology

## 2015-05-01 MED ORDER — METHADONE HCL 10 MG PO TABS: ORAL_TABLET | ORAL | Status: DC

## 2015-05-01 MED ORDER — OXYCODONE HCL 15 MG PO TABS: 15.0000 mg | ORAL_TABLET | Freq: Four times a day (QID) | ORAL | Status: DC | PRN

## 2015-05-01 NOTE — Telephone Encounter (Signed)
Patient calling for written refills for methadone (DOLOPHINE) 10 MG tablet and oxyCODONE (ROXICODONE) 15 MG immediate release tablet. Patient states if possible he would like to pick these up around lunch time today. His wife is having back issues and is gong to [facility] today to see a Dr. Patient can be reached at [phone].

## 2015-05-01 NOTE — Telephone Encounter (Signed)
Rx ready for pick up. 

## 2015-05-01 NOTE — Telephone Encounter (Signed)
Request entered, forwarded to provider for approval.  

## 2015-05-26 ENCOUNTER — Telehealth: Payer: Self-pay | Admitting: Neurology

## 2015-05-26 NOTE — Telephone Encounter (Signed)
Pt called to have medication refill on methadone (DOLOPHINE) 10 MG tablet and oxyCODONE (ROXICODONE) 15 MG immediate release tablet.Patient advised that we are closed on Monday July 4th and Rx may not be ready until Tuesday.  May call [phone]

## 2015-05-30 ENCOUNTER — Other Ambulatory Visit: Payer: Self-pay

## 2015-05-30 DIAGNOSIS — M549 Dorsalgia, unspecified: Principal | ICD-10-CM

## 2015-05-30 DIAGNOSIS — G894 Chronic pain syndrome: Secondary | ICD-10-CM

## 2015-05-30 DIAGNOSIS — G8929 Other chronic pain: Secondary | ICD-10-CM

## 2015-05-30 MED ORDER — OXYCODONE HCL 15 MG PO TABS: 15.0000 mg | ORAL_TABLET | Freq: Four times a day (QID) | ORAL | Status: DC | PRN

## 2015-05-30 MED ORDER — METHADONE HCL 10 MG PO TABS
ORAL_TABLET | ORAL | Status: DC
Start: 2015-05-30 — End: 2015-06-27

## 2015-05-30 NOTE — Telephone Encounter (Signed)
Was this taken care of?

## 2015-05-31 NOTE — Telephone Encounter (Signed)
Yes. I'm sorry. I thought you were the [facility], but it was Dr. Felecia Shelling. He signed them.

## 2015-06-26 ENCOUNTER — Telehealth: Payer: Self-pay | Admitting: Neurology

## 2015-06-26 NOTE — Telephone Encounter (Signed)
Rx's were last written on 07/05.  I called patient back on cell, got no answer, VM has not been set up.  Called home, got no answer, mailbox was full.  Unable to leave message on either line.

## 2015-06-26 NOTE — Telephone Encounter (Signed)
Pt called requesting refills for Methadone and Oxycodone

## 2015-06-27 ENCOUNTER — Other Ambulatory Visit: Payer: Self-pay

## 2015-06-27 ENCOUNTER — Telehealth: Payer: Self-pay

## 2015-06-27 DIAGNOSIS — M549 Dorsalgia, unspecified: Principal | ICD-10-CM

## 2015-06-27 DIAGNOSIS — G8929 Other chronic pain: Secondary | ICD-10-CM

## 2015-06-27 DIAGNOSIS — G894 Chronic pain syndrome: Secondary | ICD-10-CM

## 2015-06-27 MED ORDER — OXYCODONE HCL 15 MG PO TABS
15.0000 mg | ORAL_TABLET | Freq: Four times a day (QID) | ORAL | Status: DC | PRN
Start: 2015-06-27 — End: 2015-07-25

## 2015-06-27 MED ORDER — METHADONE HCL 10 MG PO TABS: ORAL_TABLET | ORAL | Status: DC

## 2015-06-27 NOTE — Telephone Encounter (Signed)
Rx ready for pick up. 

## 2015-07-25 ENCOUNTER — Telehealth: Payer: Self-pay

## 2015-07-25 ENCOUNTER — Other Ambulatory Visit: Payer: Self-pay | Admitting: Neurology

## 2015-07-25 DIAGNOSIS — M549 Dorsalgia, unspecified: Principal | ICD-10-CM

## 2015-07-25 DIAGNOSIS — G8929 Other chronic pain: Secondary | ICD-10-CM

## 2015-07-25 DIAGNOSIS — G894 Chronic pain syndrome: Secondary | ICD-10-CM

## 2015-07-25 MED ORDER — METHADONE HCL 10 MG PO TABS: ORAL_TABLET | ORAL | Status: DC

## 2015-07-25 MED ORDER — OXYCODONE HCL 15 MG PO TABS: 15.0000 mg | ORAL_TABLET | Freq: Four times a day (QID) | ORAL | Status: DC | PRN

## 2015-07-25 NOTE — Telephone Encounter (Signed)
Today is the 28th day since last Rx.  Request entered, forwarded to provider for review.

## 2015-07-25 NOTE — Telephone Encounter (Signed)
Patient is calling to order written Rx for the following:  Methadone 10 mg tablets; Oxydodone 15 mg immediate release tablets.  Thanks!

## 2015-07-25 NOTE — Telephone Encounter (Signed)
Rx ready for pick up. 

## 2015-08-16 ENCOUNTER — Ambulatory Visit: Payer: Self-pay | Admitting: Adult Health

## 2015-08-22 ENCOUNTER — Telehealth: Payer: Self-pay | Admitting: Neurology

## 2015-08-22 NOTE — Telephone Encounter (Signed)
Patient is calling to order 2 written Rx:  Methadone 10 mg and Oxycodone 15 mg.  The patient is coming in tomorrow morning for an appointment.  Thanks!

## 2015-08-22 NOTE — Telephone Encounter (Signed)
The prescriptions will be written at the time of the appointment.

## 2015-08-23 ENCOUNTER — Encounter: Payer: Self-pay | Admitting: Adult Health

## 2015-08-23 ENCOUNTER — Ambulatory Visit (INDEPENDENT_AMBULATORY_CARE_PROVIDER_SITE_OTHER): Payer: Self-pay | Admitting: Adult Health

## 2015-08-23 VITALS — BP 126/71 | HR 70 | Ht 69.0 in | Wt 154.0 lb

## 2015-08-23 DIAGNOSIS — G8929 Other chronic pain: Secondary | ICD-10-CM

## 2015-08-23 DIAGNOSIS — G894 Chronic pain syndrome: Secondary | ICD-10-CM

## 2015-08-23 DIAGNOSIS — M549 Dorsalgia, unspecified: Secondary | ICD-10-CM

## 2015-08-23 DIAGNOSIS — Z5181 Encounter for therapeutic drug level monitoring: Secondary | ICD-10-CM

## 2015-08-23 DIAGNOSIS — Z79891 Long term (current) use of opiate analgesic: Secondary | ICD-10-CM

## 2015-08-23 MED ORDER — OXYCODONE HCL 15 MG PO TABS: 15.0000 mg | ORAL_TABLET | Freq: Four times a day (QID) | ORAL | Status: DC | PRN

## 2015-08-23 MED ORDER — METHADONE HCL 10 MG PO TABS: ORAL_TABLET | ORAL | Status: DC

## 2015-08-23 NOTE — Patient Instructions (Signed)
Continue Methadone and Oxycodone for pain. We complete drug test today. If your symptoms worsen or you develop new symptoms please let us know.

## 2015-08-23 NOTE — Progress Notes (Signed)
I have read the note, and I agree with the clinical assessment and plan.  WILLIS,CHARLES KEITH   

## 2015-08-23 NOTE — Progress Notes (Signed)
PATIENT: Randy Chase DOB: Aug 09, 1951  REASON FOR VISIT: follow up- chronic back pain and arthritic pains HISTORY FROM: patient  HISTORY OF PRESENT ILLNESS: Randy Chase is a 64 year old white male with a history of chronic back pain and generalized arthritic pains. The patient states that his pain is worse when he is inactive. He states that the hot weather enabled him from being outdoors and being active therefore his discomfort gets slightly worse. He continues to have some numbness in the right foot. He currently takes methadone and oxycodone to control his pain. He states this is working well for him. At the last visit he did sign a narcotic agreement. He denies any new medical issues. He returns today for an evaluation.  HISTORY 02/13/15: Randy Chase is a 64 year old right-handed white male with a history of chronic back pain and generalized arthritic pains. The patient indicates that his pain generally will increase with cold weather. He has some swelling and pain in the right foot from a prior injury to the foot. He remains on methadone on a regular basis. He has not yet gotten his prescription for Prozac. He believes that the medication does help him. He returns for an evaluation. He denies any new medical issues that have come up since last seen. He is under increased stress as his daughter and her son have moved into their home.  REVIEW OF SYSTEMS: Out of a complete 14 system review of symptoms, the patient complains only of the following symptoms, and all other reviewed systems are negative.  Loss of vision, abdominal pain, leg swelling, frequent waking, back pain, joint pain, frequency of urination, frequent infections, bruise/bleed easily, numbness, depression, nervous/anxious  ALLERGIES: Allergies  Allergen Reactions  . Reglan [Metoclopramide]     HOME MEDICATIONS: Outpatient Prescriptions Prior to Visit  Medication Sig Dispense Refill  . ibuprofen (ADVIL,MOTRIN) 200 MG tablet  Take 200 mg by mouth 2 (two) times daily as needed for pain.    Marland Kitchen loratadine (CLARITIN) 10 MG tablet Take 10 mg by mouth daily.    . methadone (DOLOPHINE) 10 MG tablet Take 2 tablets in the morning and 3 tablets in the evening 150 tablet 0  . oxyCODONE (ROXICODONE) 15 MG immediate release tablet Take 1 tablet (15 mg total) by mouth every 6 (six) hours as needed. 120 tablet 0  . Fluoxetine HCl, PMDD, 10 MG TABS Take 1 tablet (10 mg total) by mouth daily. (Patient not taking: Reported on 08/23/2015) 30 each 5   No facility-administered medications prior to visit.    PAST MEDICAL HISTORY: Past Medical History  Diagnosis Date  . Low back pain   . Colitis   . Arthritis   . Bladder cancer   . Anxiety     PAST SURGICAL HISTORY: Past Surgical History  Procedure Laterality Date  . Bladder cancer    . Splenic artery embolization      Splenic resection  . Hernia repair      Right inguinal    FAMILY HISTORY: Family History  Problem Relation Age of Onset  . Colon cancer Mother     SOCIAL HISTORY: Social History   Social History  . Marital Status: Married    Spouse Name: N/A  . Number of Children: 1  . Years of Education: 12   Occupational History  . Not on file.   Social History Main Topics  . Smoking status: Current Every Day Smoker    Types: Cigarettes  . Smokeless tobacco: Never Used  .  Alcohol Use: 16.8 oz/week    8 Standard drinks or equivalent, 20 Cans of beer per week  . Drug Use: No  . Sexual Activity: Not on file   Other Topics Concern  . Not on file   Social History Narrative   Patient is right handed.    Patient does not drink caffeine.      PHYSICAL EXAM  Filed Vitals:   08/23/15 1123  BP: 126/71  Pulse: 70  Height: 5\' 9"  (1.753 m)  Weight: 154 lb (69.854 kg)   Body mass index is 22.73 kg/(m^2).  Generalized: Well developed, in no acute distress   Neurological examination  Mentation: Alert oriented to time, place, history taking. Follows  all commands speech and language fluent Cranial nerve II-XII: Pupils were equal round reactive to light. Extraocular movements were full, visual field were full on confrontational test. Facial sensation and strength were normal. Uvula tongue midline. Head turning and shoulder shrug  were normal and symmetric. Motor: The motor testing reveals 5 over 5 strength of all 4 extremities. Good symmetric motor tone is noted throughout.  Sensory: Sensory testing is intact to soft touch on all 4 extremities. No evidence of extinction is noted.  Coordination: Cerebellar testing reveals good finger-nose-finger and heel-to-shin bilaterally.  Gait and station: Gait is normal. Tandem gait is normal. Romberg is negative. No drift is seen.  Reflexes: Deep tendon reflexes are symmetric and normal bilaterally.   DIAGNOSTIC DATA (LABS, IMAGING, TESTING) - I reviewed patient records, labs, notes, testing and imaging myself where available.     ASSESSMENT AND PLAN 64 y.o. year old male  has a past medical history of Low back pain; Colitis; Arthritis; Bladder cancer; and Anxiety. here with:  1. Chronic back pain 2. Generalized arthritic pain  Overall the patient is doing well. Methadone and oxycodone seem to be controlling his pain well. We will check a urine drug screen today. I have provided the patient with a refill of methadone and oxycodone. If his pain worsens or he develops new symptoms. He will let us know. He will return in 6 months or sooner if needed.  Ward Givens, MSN, NP-C 08/23/2015, 11:50 AM [facility] [address], [place], [place] [address] [phone]

## 2015-08-31 LAB — COMPREHENSIVE DRUG ANALYSIS,UR: PDF: 0

## 2015-09-01 ENCOUNTER — Telehealth: Payer: Self-pay

## 2015-09-01 NOTE — Telephone Encounter (Signed)
Called patient.  No answer.

## 2015-09-01 NOTE — Telephone Encounter (Signed)
-----   Message from Ward Givens, NP sent at 08/31/2015  4:44 PM EDT ----- Lab work is ok. Please call the patient.

## 2015-09-04 NOTE — Telephone Encounter (Signed)
Called left detailed message on hp# vmail per DPR.

## 2015-09-19 ENCOUNTER — Telehealth: Payer: Self-pay

## 2015-09-19 ENCOUNTER — Other Ambulatory Visit: Payer: Self-pay | Admitting: Neurology

## 2015-09-19 DIAGNOSIS — G894 Chronic pain syndrome: Secondary | ICD-10-CM

## 2015-09-19 DIAGNOSIS — M549 Dorsalgia, unspecified: Principal | ICD-10-CM

## 2015-09-19 DIAGNOSIS — G8929 Other chronic pain: Secondary | ICD-10-CM

## 2015-09-19 MED ORDER — METHADONE HCL 10 MG PO TABS: ORAL_TABLET | ORAL | Status: DC

## 2015-09-19 MED ORDER — OXYCODONE HCL 15 MG PO TABS: 15.0000 mg | ORAL_TABLET | Freq: Four times a day (QID) | ORAL | Status: DC | PRN

## 2015-09-19 NOTE — Telephone Encounter (Signed)
Rx ready for pick up. 

## 2015-09-19 NOTE — Telephone Encounter (Signed)
Pt needs refill on methadone (DOLOPHINE) 10 MG tablet and oxyCODONE (ROXICODONE) 15 MG immediate release tablet. Thank you

## 2015-10-16 ENCOUNTER — Other Ambulatory Visit: Payer: Self-pay | Admitting: Neurology

## 2015-10-16 ENCOUNTER — Telehealth: Payer: Self-pay

## 2015-10-16 DIAGNOSIS — G894 Chronic pain syndrome: Secondary | ICD-10-CM

## 2015-10-16 DIAGNOSIS — G8929 Other chronic pain: Secondary | ICD-10-CM

## 2015-10-16 DIAGNOSIS — M549 Dorsalgia, unspecified: Principal | ICD-10-CM

## 2015-10-16 MED ORDER — OXYCODONE HCL 15 MG PO TABS: 15.0000 mg | ORAL_TABLET | Freq: Four times a day (QID) | ORAL | Status: DC | PRN

## 2015-10-16 MED ORDER — METHADONE HCL 10 MG PO TABS: ORAL_TABLET | ORAL | Status: DC

## 2015-10-16 NOTE — Telephone Encounter (Signed)
Rx ready for pick up. 

## 2015-10-16 NOTE — Telephone Encounter (Signed)
Patient called to request refills of methadone (DOLOPHINE) 10 MG tablet and oxyCODONE (ROXICODONE) 15 MG immediate release tablet

## 2015-11-13 ENCOUNTER — Telehealth: Payer: Self-pay | Admitting: Neurology

## 2015-11-13 ENCOUNTER — Other Ambulatory Visit: Payer: Self-pay | Admitting: Neurology

## 2015-11-13 DIAGNOSIS — M549 Dorsalgia, unspecified: Principal | ICD-10-CM

## 2015-11-13 DIAGNOSIS — G8929 Other chronic pain: Secondary | ICD-10-CM

## 2015-11-13 DIAGNOSIS — G894 Chronic pain syndrome: Secondary | ICD-10-CM

## 2015-11-13 MED ORDER — OXYCODONE HCL 15 MG PO TABS: 15.0000 mg | ORAL_TABLET | Freq: Four times a day (QID) | ORAL | Status: DC | PRN

## 2015-11-13 MED ORDER — METHADONE HCL 10 MG PO TABS: ORAL_TABLET | ORAL | Status: DC

## 2015-11-13 NOTE — Telephone Encounter (Signed)
Called patient and left message on home telephone RX's ready for pick up relayed office hours. Patient's cell phone voice mail not set up yet.

## 2015-11-13 NOTE — Telephone Encounter (Signed)
Requests entered, forwarded to provider for approval.   

## 2015-11-13 NOTE — Telephone Encounter (Signed)
Pt called requesting refill for methadone (DOLOPHINE) 10 MG tablet and oxyCODONE (ROXICODONE) 15 MG immediate release tablet . Pt advised RX will be ready within 24 hrs unless otherwise informed by RN

## 2015-12-08 ENCOUNTER — Other Ambulatory Visit: Payer: Self-pay | Admitting: Neurology

## 2015-12-08 NOTE — Telephone Encounter (Signed)
It appears these Rx's were last written on 12/19, it is a few days too soon to refill at this time.  I called the patient back at number provided.  Got no answer. VM has not been set up, was unable to leave message.   Called home number listed.  Left message.

## 2015-12-08 NOTE — Telephone Encounter (Signed)
Patient called to request refills of methadone (DOLOPHINE) 10 MG tablet and oxyCODONE (ROXICODONE) 15 MG immediate release tablet

## 2015-12-11 ENCOUNTER — Other Ambulatory Visit: Payer: Self-pay

## 2015-12-11 DIAGNOSIS — G8929 Other chronic pain: Secondary | ICD-10-CM

## 2015-12-11 DIAGNOSIS — M549 Dorsalgia, unspecified: Principal | ICD-10-CM

## 2015-12-11 DIAGNOSIS — G894 Chronic pain syndrome: Secondary | ICD-10-CM

## 2015-12-11 MED ORDER — OXYCODONE HCL 15 MG PO TABS
15.0000 mg | ORAL_TABLET | Freq: Four times a day (QID) | ORAL | Status: DC | PRN
Start: 2015-12-11 — End: 2016-01-09

## 2015-12-11 MED ORDER — METHADONE HCL 10 MG PO TABS: ORAL_TABLET | ORAL | Status: DC

## 2015-12-11 NOTE — Telephone Encounter (Signed)
Pt called back today to see if rx is ready ? May call back [phone]

## 2015-12-11 NOTE — Telephone Encounter (Signed)
I called back.  They are aware we will notify him when Rx's are ready.

## 2015-12-11 NOTE — Telephone Encounter (Signed)
Pt called inquiring what day RX will be ready? Please call at [phone]

## 2015-12-12 NOTE — Telephone Encounter (Signed)
Patient called to check status of Rx refill for Methadone and Oxycodone. Patient wants to know why it's taking so long "this is ridiculous".

## 2015-12-12 NOTE — Telephone Encounter (Signed)
Prescription for methadone, and oxycodone left at front desk for patient. Rn call patient to let him know. Pt stated his wife will pick it up. Rn reminded patient that a valid ID is necessary for the pick up.

## 2016-01-08 ENCOUNTER — Telehealth: Payer: Self-pay | Admitting: Neurology

## 2016-01-08 NOTE — Telephone Encounter (Signed)
Pt needs refill on methadone (DOLOPHINE) 10 MG tablet and oxyCODONE (ROXICODONE) 15 MG immediate release tablet

## 2016-01-08 NOTE — Telephone Encounter (Signed)
Encounter was already closed when it was forwarded

## 2016-01-09 ENCOUNTER — Other Ambulatory Visit: Payer: Self-pay

## 2016-01-09 ENCOUNTER — Telehealth: Payer: Self-pay

## 2016-01-09 DIAGNOSIS — G8929 Other chronic pain: Secondary | ICD-10-CM

## 2016-01-09 DIAGNOSIS — M549 Dorsalgia, unspecified: Principal | ICD-10-CM

## 2016-01-09 DIAGNOSIS — G894 Chronic pain syndrome: Secondary | ICD-10-CM

## 2016-01-09 MED ORDER — METHADONE HCL 10 MG PO TABS: ORAL_TABLET | ORAL | Status: DC

## 2016-01-09 MED ORDER — OXYCODONE HCL 15 MG PO TABS: 15.0000 mg | ORAL_TABLET | Freq: Four times a day (QID) | ORAL | Status: DC | PRN

## 2016-01-09 NOTE — Telephone Encounter (Signed)
Request has been approved by provider

## 2016-01-09 NOTE — Telephone Encounter (Signed)
Rx ready for pick up. 

## 2016-02-05 ENCOUNTER — Telehealth: Payer: Self-pay | Admitting: Neurology

## 2016-02-05 DIAGNOSIS — G894 Chronic pain syndrome: Secondary | ICD-10-CM

## 2016-02-05 DIAGNOSIS — M549 Dorsalgia, unspecified: Principal | ICD-10-CM

## 2016-02-05 DIAGNOSIS — G8929 Other chronic pain: Secondary | ICD-10-CM

## 2016-02-05 MED ORDER — OXYCODONE HCL 15 MG PO TABS: 15.0000 mg | ORAL_TABLET | Freq: Four times a day (QID) | ORAL | Status: DC | PRN

## 2016-02-05 MED ORDER — METHADONE HCL 10 MG PO TABS: ORAL_TABLET | ORAL | Status: DC

## 2016-02-05 NOTE — Telephone Encounter (Signed)
Patient is calling to order the following written Rx:  Oxycodone 15 mg immediate release tablets.   Methadone 10 mg.  Thanks!

## 2016-02-05 NOTE — Telephone Encounter (Signed)
Prescription is printed/signed and given to RN.

## 2016-02-06 NOTE — Telephone Encounter (Signed)
LM for patient to let him know that his 2 Rx are ready to pick up at front desk .

## 2016-02-22 ENCOUNTER — Encounter: Payer: Self-pay | Admitting: Neurology

## 2016-02-22 ENCOUNTER — Ambulatory Visit (INDEPENDENT_AMBULATORY_CARE_PROVIDER_SITE_OTHER): Payer: Self-pay | Admitting: Neurology

## 2016-02-22 VITALS — BP 153/86 | HR 72 | Ht 69.0 in | Wt 170.2 lb

## 2016-02-22 DIAGNOSIS — G894 Chronic pain syndrome: Secondary | ICD-10-CM

## 2016-02-22 DIAGNOSIS — M549 Dorsalgia, unspecified: Secondary | ICD-10-CM

## 2016-02-22 DIAGNOSIS — G8929 Other chronic pain: Secondary | ICD-10-CM
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
## 2016-03-04 ENCOUNTER — Other Ambulatory Visit: Payer: Self-pay | Admitting: Neurology

## 2016-03-04 DIAGNOSIS — G894 Chronic pain syndrome: Secondary | ICD-10-CM

## 2016-03-04 DIAGNOSIS — G8929 Other chronic pain: Secondary | ICD-10-CM

## 2016-03-04 DIAGNOSIS — M549 Dorsalgia, unspecified: Principal | ICD-10-CM

## 2016-03-04 MED ORDER — OXYCODONE HCL 15 MG PO TABS: 15.0000 mg | ORAL_TABLET | Freq: Four times a day (QID) | ORAL | Status: DC | PRN

## 2016-03-04 MED ORDER — METHADONE HCL 10 MG PO TABS: ORAL_TABLET | ORAL | Status: DC

## 2016-03-04 NOTE — Telephone Encounter (Signed)
Pt needs refill on methadone (DOLOPHINE) 10 MG tablet , oxyCODONE (ROXICODONE) 15 MG immediate release tablet

## 2016-03-04 NOTE — Telephone Encounter (Signed)
Prescriptions placed up front for pickup. 

## 2016-04-01 ENCOUNTER — Other Ambulatory Visit: Payer: Self-pay | Admitting: Neurology

## 2016-04-01 DIAGNOSIS — G8929 Other chronic pain: Secondary | ICD-10-CM

## 2016-04-01 DIAGNOSIS — M549 Dorsalgia, unspecified: Principal | ICD-10-CM

## 2016-04-01 DIAGNOSIS — G894 Chronic pain syndrome: Secondary | ICD-10-CM

## 2016-04-01 MED ORDER — OXYCODONE HCL 15 MG PO TABS: 15.0000 mg | ORAL_TABLET | Freq: Four times a day (QID) | ORAL | Status: DC | PRN

## 2016-04-01 MED ORDER — METHADONE HCL 10 MG PO TABS: ORAL_TABLET | ORAL | Status: DC

## 2016-04-01 NOTE — Telephone Encounter (Signed)
Last OV 02/22/16 w/ f/u scheduled 08/26/16. Last filled both on 03/04/16.

## 2016-04-01 NOTE — Telephone Encounter (Signed)
RXs printed, signed, up front for pick-up.

## 2016-04-01 NOTE — Telephone Encounter (Signed)
Patient requesting refill of  methadone (DOLOPHINE) 10 MG tablet and oxyCODONE (ROXICODONE) 15 MG immediate release tab

## 2016-04-29 ENCOUNTER — Other Ambulatory Visit: Payer: Self-pay | Admitting: Neurology

## 2016-04-29 DIAGNOSIS — G8929 Other chronic pain: Secondary | ICD-10-CM

## 2016-04-29 DIAGNOSIS — M549 Dorsalgia, unspecified: Principal | ICD-10-CM

## 2016-04-29 DIAGNOSIS — G894 Chronic pain syndrome: Secondary | ICD-10-CM

## 2016-04-29 MED ORDER — OXYCODONE HCL 15 MG PO TABS: 15.0000 mg | ORAL_TABLET | Freq: Four times a day (QID) | ORAL | Status: DC | PRN

## 2016-04-29 MED ORDER — METHADONE HCL 10 MG PO TABS: ORAL_TABLET | ORAL | Status: DC

## 2016-04-29 NOTE — Telephone Encounter (Signed)
Patient is calling to order 2 written Rx:  Methadone 10 mg.   Oxycodone 15 mg immediate release tablets.  Thanks!

## 2016-04-29 NOTE — Telephone Encounter (Signed)
Last OV 02/22/16 F/u scheduled w/ Megan NP on 08/26/16 Last refilled 04/01/16

## 2016-04-30 NOTE — Telephone Encounter (Signed)
RXs printed, signed, up front for pick-up.

## 2016-05-24 ENCOUNTER — Other Ambulatory Visit: Payer: Self-pay | Admitting: Neurology

## 2016-05-24 DIAGNOSIS — M549 Dorsalgia, unspecified: Principal | ICD-10-CM

## 2016-05-24 DIAGNOSIS — G894 Chronic pain syndrome: Secondary | ICD-10-CM

## 2016-05-24 DIAGNOSIS — G8929 Other chronic pain: Secondary | ICD-10-CM

## 2016-05-24 NOTE — Telephone Encounter (Signed)
Patient requesting refill of methadone (DOLOPHINE) 10 MG tablet , oxyCODONE (ROXICODONE) 15 MG immediate release tablet Pharmacy: pick up

## 2016-05-24 NOTE — Telephone Encounter (Signed)
Last OV 02/22/16 F/u scheduled w/ Megan NP 08/26/16 Last refills 04/29/16

## 2016-05-27 MED ORDER — OXYCODONE HCL 15 MG PO TABS: 15.0000 mg | ORAL_TABLET | Freq: Four times a day (QID) | ORAL | Status: DC | PRN

## 2016-05-27 MED ORDER — METHADONE HCL 10 MG PO TABS: ORAL_TABLET | ORAL | Status: DC

## 2016-05-27 NOTE — Telephone Encounter (Signed)
Rx printed, signed, up front for pick-up. 

## 2016-06-24 ENCOUNTER — Other Ambulatory Visit: Payer: Self-pay | Admitting: Neurology

## 2016-06-24 DIAGNOSIS — M549 Dorsalgia, unspecified: Principal | ICD-10-CM

## 2016-06-24 DIAGNOSIS — G894 Chronic pain syndrome: Secondary | ICD-10-CM

## 2016-06-24 DIAGNOSIS — G8929 Other chronic pain: Secondary | ICD-10-CM

## 2016-06-24 MED ORDER — METHADONE HCL 10 MG PO TABS: ORAL_TABLET | ORAL | 0 refills | Status: DC

## 2016-06-24 MED ORDER — OXYCODONE HCL 15 MG PO TABS: 15.0000 mg | ORAL_TABLET | Freq: Four times a day (QID) | ORAL | 0 refills | Status: DC | PRN

## 2016-06-24 NOTE — Telephone Encounter (Signed)
OV 02/22/16 FU w/ Megan NP 08/26/16 RF 05/27/16

## 2016-06-24 NOTE — Telephone Encounter (Signed)
Patient called to request refills of methadone (DOLOPHINE) 10 MG tablet and oxyCODONE (ROXICODONE) 15 MG immediate release tablet

## 2016-06-24 NOTE — Telephone Encounter (Signed)
RXs printed, signed, up front for pick-up.

## 2016-07-22 ENCOUNTER — Other Ambulatory Visit: Payer: Self-pay | Admitting: Neurology

## 2016-07-22 DIAGNOSIS — M549 Dorsalgia, unspecified: Principal | ICD-10-CM

## 2016-07-22 DIAGNOSIS — G894 Chronic pain syndrome: Secondary | ICD-10-CM

## 2016-07-22 DIAGNOSIS — G8929 Other chronic pain: Secondary | ICD-10-CM

## 2016-07-22 MED ORDER — METHADONE HCL 10 MG PO TABS: ORAL_TABLET | ORAL | 0 refills | Status: DC

## 2016-07-22 MED ORDER — OXYCODONE HCL 15 MG PO TABS: 15.0000 mg | ORAL_TABLET | Freq: Four times a day (QID) | ORAL | 0 refills | Status: DC | PRN

## 2016-07-22 NOTE — Telephone Encounter (Signed)
Rx printed, signed, up front for pick-up. 

## 2016-07-22 NOTE — Telephone Encounter (Signed)
Patient called to request refills of methadone (DOLOPHINE) 10 MG tablet and oxyCODONE (ROXICODONE) 15 MG immediate release tablet

## 2016-07-22 NOTE — Telephone Encounter (Signed)
Last OV was March 2017 Follow-up is scheduled w/ Megan NP in Oct 2017 Last refills 06/24/16

## 2016-08-19 ENCOUNTER — Other Ambulatory Visit: Payer: Self-pay

## 2016-08-19 ENCOUNTER — Telehealth: Payer: Self-pay | Admitting: Adult Health

## 2016-08-19 DIAGNOSIS — M549 Dorsalgia, unspecified: Principal | ICD-10-CM

## 2016-08-19 DIAGNOSIS — G894 Chronic pain syndrome: Secondary | ICD-10-CM

## 2016-08-19 DIAGNOSIS — G8929 Other chronic pain: Secondary | ICD-10-CM

## 2016-08-19 MED ORDER — OXYCODONE HCL 15 MG PO TABS: 15.0000 mg | ORAL_TABLET | Freq: Four times a day (QID) | ORAL | 0 refills | Status: DC | PRN

## 2016-08-19 MED ORDER — METHADONE HCL 10 MG PO TABS: ORAL_TABLET | ORAL | 0 refills | Status: DC

## 2016-08-19 NOTE — Telephone Encounter (Signed)
Patient called to request refill of methadone (DOLOPHINE) 10 MG tablet and oxyCODONE (ROXICODONE) 15 MG immediate release tablet

## 2016-08-19 NOTE — Telephone Encounter (Signed)
Patient last had methadone and oxycodone filled on 8/282017. Pt cannot filled until 08/22/2016. Dr. Jannifer Franklin will be here to sign it on 08/20/2016.

## 2016-08-19 NOTE — Telephone Encounter (Signed)
Prescription will be ready for pick up on 08/21/2016.

## 2016-08-20 NOTE — Telephone Encounter (Signed)
RXs printed, signed and up front for pt pick-up.

## 2016-08-26 ENCOUNTER — Ambulatory Visit (INDEPENDENT_AMBULATORY_CARE_PROVIDER_SITE_OTHER): Payer: Self-pay | Admitting: Adult Health

## 2016-08-26 ENCOUNTER — Encounter: Payer: Self-pay | Admitting: Adult Health

## 2016-08-26 VITALS — BP 160/88 | HR 75 | Ht 69.0 in | Wt 169.2 lb

## 2016-08-26 DIAGNOSIS — Z79891 Long term (current) use of opiate analgesic: Secondary | ICD-10-CM

## 2016-08-26 DIAGNOSIS — G8929 Other chronic pain: Secondary | ICD-10-CM

## 2016-08-26 DIAGNOSIS — Z5181 Encounter for therapeutic drug level monitoring: Secondary | ICD-10-CM

## 2016-08-26 DIAGNOSIS — M545 Low back pain, unspecified: Secondary | ICD-10-CM
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
## 2016-08-26 NOTE — Patient Instructions (Signed)
Urine drug screen today Establish care with Primary Care Provider If your symptoms worsen or you develop new symptoms please let us know.

## 2016-08-26 NOTE — Addendum Note (Signed)
Addended by: Trudie Buckler on: 08/26/2016 02:21 PM   Modules accepted: Orders

## 2016-08-26 NOTE — Progress Notes (Signed)
I have read the note, and I agree with the clinical assessment and plan.  Kale Rondeau KEITH   

## 2016-09-02 ENCOUNTER — Telehealth: Payer: Self-pay | Admitting: *Deleted

## 2016-09-02 LAB — DRUG SCREEN, UR (12+OXYCODONE+CRT)
Amphetamine Scrn, Ur: NEGATIVE ng/mL
BARBITURATE SCREEN URINE: NEGATIVE ng/mL
BENZODIAZEPINE SCREEN, URINE: NEGATIVE ng/mL
CANNABINOIDS UR QL SCN: NEGATIVE ng/mL
Cocaine (Metab) Scrn, Ur: NEGATIVE ng/mL
Creatinine(Crt), U: 36.3 mg/dL (ref 20.0–300.0)
Fentanyl, Urine: NEGATIVE pg/mL
Meperidine Screen, Urine: NEGATIVE ng/mL
Methadone Screen, Urine: POSITIVE — AB
OXYCODONE+OXYMORPHONE UR QL SCN: POSITIVE — AB
Opiate Scrn, Ur: NEGATIVE ng/mL
PH UR, DRUG SCRN: 7.3 (ref 4.5–8.9)
PROPOXYPHENE SCREEN URINE: NEGATIVE ng/mL
Phencyclidine Qn, Ur: NEGATIVE ng/mL
SPECIFIC GRAVITY: 1.009
Tramadol Screen, Urine: NEGATIVE ng/mL

## 2016-09-02 NOTE — Telephone Encounter (Signed)
Spoke to pt and relayed that his lab work results were ok.  He verbalized understanding.

## 2016-09-02 NOTE — Telephone Encounter (Signed)
-----   Message from Ward Givens, NP sent at 09/02/2016  1:16 PM EDT ----- Lab work ok. Please call patient.

## 2016-09-16 ENCOUNTER — Other Ambulatory Visit: Payer: Self-pay | Admitting: Neurology

## 2016-09-16 DIAGNOSIS — G894 Chronic pain syndrome: Secondary | ICD-10-CM

## 2016-09-16 MED ORDER — METHADONE HCL 10 MG PO TABS: ORAL_TABLET | ORAL | 0 refills | Status: DC

## 2016-09-16 MED ORDER — OXYCODONE HCL 15 MG PO TABS: 15.0000 mg | ORAL_TABLET | Freq: Four times a day (QID) | ORAL | 0 refills | Status: DC | PRN

## 2016-09-16 NOTE — Telephone Encounter (Signed)
Patient requesting refill of oxyCODONE (ROXICODONE) 15 MG immediate release tablet , methadone (DOLOPHINE) 10 MG tablet Pharmacy: pick up

## 2016-09-16 NOTE — Telephone Encounter (Signed)
RXs printed, signed, up front for pick-up.

## 2016-09-16 NOTE — Telephone Encounter (Signed)
Last visit 10/2 w/ f/u scheduled in April. Most recent scripts written 08/19/16.

## 2016-10-14 ENCOUNTER — Other Ambulatory Visit: Payer: Self-pay | Admitting: Adult Health

## 2016-10-14 DIAGNOSIS — G894 Chronic pain syndrome: Secondary | ICD-10-CM

## 2016-10-14 MED ORDER — METHADONE HCL 10 MG PO TABS
ORAL_TABLET | ORAL | 0 refills | Status: DC
Start: 2016-10-14 — End: 2016-11-11

## 2016-10-14 MED ORDER — OXYCODONE HCL 15 MG PO TABS: 15.0000 mg | ORAL_TABLET | Freq: Four times a day (QID) | ORAL | 0 refills | Status: DC | PRN

## 2016-10-14 NOTE — Telephone Encounter (Signed)
RXs printed, signed, up front for pick-up.

## 2016-10-14 NOTE — Telephone Encounter (Signed)
Pt request refill for oxyCODONE (ROXICODONE) 15 MG immediate release tablet and methadone (DOLOPHINE) 10 MG tablet . Pt is aware the clinic is closed 11/23 & 24.

## 2016-10-14 NOTE — Telephone Encounter (Signed)
Pt was seen for OV in Oct and has f/u scheduled in April. Last rx written 09/16/16.

## 2016-11-11 ENCOUNTER — Other Ambulatory Visit: Payer: Self-pay | Admitting: Adult Health

## 2016-11-11 DIAGNOSIS — G894 Chronic pain syndrome: Secondary | ICD-10-CM

## 2016-11-11 MED ORDER — METHADONE HCL 10 MG PO TABS: ORAL_TABLET | ORAL | 0 refills | Status: DC

## 2016-11-11 MED ORDER — OXYCODONE HCL 15 MG PO TABS: 15.0000 mg | ORAL_TABLET | Freq: Four times a day (QID) | ORAL | 0 refills | Status: DC | PRN

## 2016-11-11 NOTE — Telephone Encounter (Signed)
Last refill  10/14/16

## 2016-11-11 NOTE — Telephone Encounter (Signed)
RXs printed, signed, up front for pick-up.

## 2016-11-11 NOTE — Telephone Encounter (Signed)
Pt request refill for methadone (DOLOPHINE) 10 MG tablet and oxyCODONE (ROXICODONE) 15 MG immediate release tablet °

## 2016-12-09 ENCOUNTER — Other Ambulatory Visit: Payer: Self-pay | Admitting: Neurology

## 2016-12-09 DIAGNOSIS — G894 Chronic pain syndrome: Secondary | ICD-10-CM

## 2016-12-09 MED ORDER — OXYCODONE HCL 15 MG PO TABS: 15.0000 mg | ORAL_TABLET | Freq: Four times a day (QID) | ORAL | 0 refills | Status: DC | PRN

## 2016-12-09 MED ORDER — METHADONE HCL 10 MG PO TABS: ORAL_TABLET | ORAL | 0 refills | Status: DC

## 2016-12-09 NOTE — Telephone Encounter (Signed)
Patient requesting refill of methadone (DOLOPHINE) 10 MG tablet and oxyCODONE (ROXICODONE) 15 MG immediate release tablet. ° °

## 2016-12-09 NOTE — Addendum Note (Signed)
Addended by: Monte Fantasia on: 12/09/2016 09:00 AM   Modules accepted: Orders

## 2016-12-09 NOTE — Telephone Encounter (Signed)
RXs printed, signed, up front for pick-up.

## 2016-12-09 NOTE — Telephone Encounter (Signed)
Pt was seen in Oct for OV and has follow-up scheduled in April. Last rx written 11/11/16.

## 2017-01-06 ENCOUNTER — Telehealth: Payer: Self-pay | Admitting: Neurology

## 2017-01-06 DIAGNOSIS — G894 Chronic pain syndrome: Secondary | ICD-10-CM

## 2017-01-06 MED ORDER — METHADONE HCL 10 MG PO TABS: ORAL_TABLET | ORAL | 0 refills | Status: DC

## 2017-01-06 MED ORDER — OXYCODONE HCL 15 MG PO TABS: 15.0000 mg | ORAL_TABLET | Freq: Four times a day (QID) | ORAL | 0 refills | Status: DC | PRN

## 2017-01-06 NOTE — Addendum Note (Signed)
Addended by: Margette Fast on: 01/06/2017 06:38 PM   Modules accepted: Orders

## 2017-01-06 NOTE — Telephone Encounter (Signed)
Prescriptions for the methadone and oxycodone were given.

## 2017-01-06 NOTE — Telephone Encounter (Signed)
Patient requesting refill of methadone (DOLOPHINE) 10 MG tablet and oxyCODONE (ROXICODONE) 15 MG immediate release tablet. ° °

## 2017-01-06 NOTE — Telephone Encounter (Signed)
Dr Willis- please advise 

## 2017-01-07 NOTE — Telephone Encounter (Signed)
Called and spoke to pt to advise rx refills placed up front for pick up. He verbalized understanding.

## 2017-02-03 ENCOUNTER — Telehealth: Payer: Self-pay | Admitting: Adult Health

## 2017-02-03 DIAGNOSIS — G894 Chronic pain syndrome: Secondary | ICD-10-CM

## 2017-02-03 MED ORDER — METHADONE HCL 10 MG PO TABS
ORAL_TABLET | ORAL | 0 refills | Status: DC
Start: 2017-02-03 — End: 2017-03-04

## 2017-02-03 MED ORDER — OXYCODONE HCL 15 MG PO TABS: 15.0000 mg | ORAL_TABLET | Freq: Four times a day (QID) | ORAL | 0 refills | Status: DC | PRN

## 2017-02-03 NOTE — Telephone Encounter (Signed)
[facility] registry was checked, the medication will be refilled for oxycodone and methadone.

## 2017-02-03 NOTE — Telephone Encounter (Signed)
Dr Jannifer Franklin- ok to refill?

## 2017-02-03 NOTE — Telephone Encounter (Signed)
Placed rx oxycodone and methadone up front for patient pick up.

## 2017-02-03 NOTE — Telephone Encounter (Signed)
Pt request refill for methadone (DOLOPHINE) 10 MG tablet and oxyCODONE (ROXICODONE) 15 MG immediate release tablet

## 2017-03-04 ENCOUNTER — Ambulatory Visit (INDEPENDENT_AMBULATORY_CARE_PROVIDER_SITE_OTHER): Payer: Self-pay | Admitting: Neurology

## 2017-03-04 ENCOUNTER — Encounter: Payer: Self-pay | Admitting: Neurology

## 2017-03-04 ENCOUNTER — Encounter (INDEPENDENT_AMBULATORY_CARE_PROVIDER_SITE_OTHER): Payer: Self-pay

## 2017-03-04 DIAGNOSIS — G894 Chronic pain syndrome: Secondary | ICD-10-CM

## 2017-03-04 MED ORDER — OXYCODONE HCL 15 MG PO TABS: 15.0000 mg | ORAL_TABLET | Freq: Four times a day (QID) | ORAL | 0 refills | Status: DC | PRN

## 2017-03-04 MED ORDER — METHADONE HCL 10 MG PO TABS: ORAL_TABLET | ORAL | 0 refills | Status: DC

## 2017-03-04 NOTE — Progress Notes (Signed)
Reason for visit: Chronic pain  Randy Chase is an 66 y.o. male  History of present illness:  Randy Chase is a 66 year old right-handed white male with a history of a chronic pain syndrome. The patient has chronic low back pain, right leg discomfort and numbness. He has arthritic pains in the hands. He has not been able to work, he has had a lot of financial problems recently. He will be converting to Medicare in the near future. The patient remains on methadone, supplementing with oxycodone. He has not had any significant new medical issues that have come up recently, but in the fall of 2017 he did have some hematuria associated with a mass in the bladder. This was surgically resected. The patient returns for an evaluation.  Past Medical History:  Diagnosis Date  . Anxiety   . Arthritis   . Bladder cancer ([place])   . Colitis   . Low back pain     Past Surgical History:  Procedure Laterality Date  . bladder cancer    . HERNIA REPAIR     Right inguinal  . SPLENIC ARTERY EMBOLIZATION     Splenic resection    Family History  Problem Relation Age of Onset  . Colon cancer Mother     Social history:  reports that he has been smoking Cigarettes.  He has been smoking about 0.25 packs per day. He has never used smokeless tobacco. He reports that he drinks about 16.8 oz of alcohol per week . He reports that he does not use drugs.    Allergies  Allergen Reactions  . Reglan [Metoclopramide]     Medications:  Prior to Admission medications   Medication Sig Start Date End Date Taking? Authorizing Provider  ibuprofen (ADVIL,MOTRIN) 200 MG tablet Take 200 mg by mouth 2 (two) times daily as needed for pain.   Yes Historical Provider, MD  loratadine (CLARITIN) 10 MG tablet Take 10 mg by mouth daily.   Yes Historical Provider, MD  methadone (DOLOPHINE) 10 MG tablet Take 2 tablets in the morning and 3 tablets in the evening 02/03/17  Yes Kathrynn Ducking, MD  oxyCODONE (ROXICODONE) 15 MG  immediate release tablet Take 1 tablet (15 mg total) by mouth every 6 (six) hours as needed. 02/03/17  Yes Kathrynn Ducking, MD  Fluoxetine HCl, PMDD, 10 MG TABS Take 1 tablet (10 mg total) by mouth daily. Patient not taking: Reported on 03/04/2017 02/13/15   Kathrynn Ducking, MD    ROS:  Out of a complete 14 system review of symptoms, the patient complains only of the following symptoms, and all other reviewed systems are negative.  Hearing loss Eye itching, light sensitivity, loss of vision Shortness of breath Frequent waking, snoring Environmental allergies Joint pain, joint swelling, back pain, walking difficulty, neck pain, neck stiffness Skin rash, itching Bruising easily Numbness Depression, anxiety  Blood pressure (!) 154/78, pulse 70, height 5\' 9"  (1.753 m), weight 159 lb (72.1 kg).  Physical Exam  General: The patient is alert and cooperative at the time of the examination.  Skin: No significant peripheral edema is noted.   Neurologic Exam  Mental status: The patient is alert and oriented x 3 at the time of the examination. The patient has apparent normal recent and remote memory, with an apparently normal attention span and concentration ability.   Cranial nerves: Facial symmetry is present. Speech is normal, no aphasia or dysarthria is noted. Extraocular movements are full. Visual fields are full.  Motor: The patient has good strength in all 4 extremities.  Sensory examination: Soft touch sensation is symmetric on the face, arms, and legs.  Coordination: The patient has good finger-nose-finger and heel-to-shin bilaterally.  Gait and station: The patient has a normal gait. Tandem gait is normal. Romberg is negative. No drift is seen.  Reflexes: Deep tendon reflexes are symmetric.   Assessment/Plan:  1. Chronic pain syndrome, chronic low back pain  The patient was given a prescription for his methadone and oxycodone today, he will follow-up in 6 months. He  did have a urine drug screen on the last visit.  Jill Alexanders MD 03/04/2017 2:31 PM  [facility] [address] [place], [place] [address]  Phone [phone] Fax [phone]

## 2017-03-31 ENCOUNTER — Telehealth: Payer: Self-pay | Admitting: Neurology

## 2017-03-31 DIAGNOSIS — G894 Chronic pain syndrome: Secondary | ICD-10-CM

## 2017-03-31 MED ORDER — OXYCODONE HCL 15 MG PO TABS: 15.0000 mg | ORAL_TABLET | Freq: Four times a day (QID) | ORAL | 0 refills | Status: DC | PRN

## 2017-03-31 MED ORDER — METHADONE HCL 10 MG PO TABS: ORAL_TABLET | ORAL | 0 refills | Status: DC

## 2017-03-31 NOTE — Telephone Encounter (Signed)
Pt calling for a refill of  methadone (DOLOPHINE) 10 MG tablet    oxyCODONE (ROXICODONE) 15 MG immediate release tablet

## 2017-03-31 NOTE — Addendum Note (Signed)
Addended by: Kathrynn Ducking on: 03/31/2017 01:10 PM   Modules accepted: Orders

## 2017-03-31 NOTE — Telephone Encounter (Signed)
Placed printed/signed rx up front for patient pick up.  

## 2017-03-31 NOTE — Telephone Encounter (Signed)
A prescription was written for the oxycodone and methadone, [facility] registry was checked.

## 2017-04-28 ENCOUNTER — Telehealth: Payer: Self-pay | Admitting: Neurology

## 2017-04-28 DIAGNOSIS — G894 Chronic pain syndrome: Secondary | ICD-10-CM

## 2017-04-28 MED ORDER — OXYCODONE HCL 15 MG PO TABS: 15.0000 mg | ORAL_TABLET | Freq: Four times a day (QID) | ORAL | 0 refills | Status: DC | PRN

## 2017-04-28 MED ORDER — METHADONE HCL 10 MG PO TABS: ORAL_TABLET | ORAL | 0 refills | Status: DC

## 2017-04-28 NOTE — Telephone Encounter (Signed)
Patient requesting refill of methadone (DOLOPHINE) 10 MG tablet and oxyCODONE (ROXICODONE) 15 MG immediate release tablet. ° °

## 2017-04-28 NOTE — Telephone Encounter (Signed)
Oxycodone and methadone were refilled.

## 2017-04-28 NOTE — Addendum Note (Signed)
Addended by: Kathrynn Ducking on: 04/28/2017 01:44 PM   Modules accepted: Orders

## 2017-04-28 NOTE — Telephone Encounter (Signed)
Rx placed up front for patient pick up.

## 2017-05-26 ENCOUNTER — Telehealth: Payer: Self-pay | Admitting: Neurology

## 2017-05-26 DIAGNOSIS — G894 Chronic pain syndrome: Secondary | ICD-10-CM

## 2017-05-26 MED ORDER — METHADONE HCL 10 MG PO TABS: ORAL_TABLET | ORAL | 0 refills | Status: DC

## 2017-05-26 MED ORDER — OXYCODONE HCL 15 MG PO TABS: 15.0000 mg | ORAL_TABLET | Freq: Four times a day (QID) | ORAL | 0 refills | Status: DC | PRN

## 2017-05-26 NOTE — Telephone Encounter (Signed)
The oxycodone and the methadone will be refilled.

## 2017-05-26 NOTE — Telephone Encounter (Signed)
Pt request refill for methadone (DOLOPHINE) 10 MG tablet, oxyCODONE (ROXICODONE) 15 MG immediate release tablet and Fluoxetine HCl, PMDD, 10 MG TABS

## 2017-05-27 ENCOUNTER — Other Ambulatory Visit: Payer: Self-pay | Admitting: *Deleted

## 2017-05-27 MED ORDER — FLUOXETINE HCL (PMDD) 10 MG PO TABS: 10.0000 mg | ORAL_TABLET | Freq: Every day | ORAL | 5 refills | Status: DC

## 2017-05-27 NOTE — Telephone Encounter (Addendum)
Spoke to patient - he would like to restart fluoxetine 10mg  daily.  Per vo by Dr. Jannifer Franklin, ok to refill previous rx.

## 2017-05-27 NOTE — Telephone Encounter (Addendum)
Oxycodone and Methadone prescriptions signed and placed up front.

## 2017-06-23 ENCOUNTER — Telehealth: Payer: Self-pay | Admitting: Neurology

## 2017-06-23 DIAGNOSIS — G894 Chronic pain syndrome: Secondary | ICD-10-CM

## 2017-06-23 MED ORDER — METHADONE HCL 10 MG PO TABS: ORAL_TABLET | ORAL | 0 refills | Status: DC

## 2017-06-23 MED ORDER — OXYCODONE HCL 15 MG PO TABS: 15.0000 mg | ORAL_TABLET | Freq: Four times a day (QID) | ORAL | 0 refills | Status: DC | PRN

## 2017-06-23 NOTE — Telephone Encounter (Signed)
The oxycodone and methadone were refilled, the [facility] registry was checked.

## 2017-06-23 NOTE — Telephone Encounter (Signed)
Placed printed/signed rx's up front for patient pick up.  

## 2017-06-23 NOTE — Addendum Note (Signed)
Addended by: Kathrynn Ducking on: 06/23/2017 01:34 PM   Modules accepted: Orders

## 2017-06-23 NOTE — Telephone Encounter (Signed)
Patient requesting refill of methadone (DOLOPHINE) 10 MG tablet and oxyCODONE (ROXICODONE) 15 MG immediate release tablet. ° °

## 2017-07-21 ENCOUNTER — Telehealth: Payer: Self-pay | Admitting: Neurology

## 2017-07-21 DIAGNOSIS — G894 Chronic pain syndrome: Secondary | ICD-10-CM

## 2017-07-21 MED ORDER — METHADONE HCL 10 MG PO TABS: ORAL_TABLET | ORAL | 0 refills | Status: DC

## 2017-07-21 MED ORDER — OXYCODONE HCL 15 MG PO TABS: 15.0000 mg | ORAL_TABLET | Freq: Four times a day (QID) | ORAL | 0 refills | Status: DC | PRN

## 2017-07-21 NOTE — Addendum Note (Signed)
Addended by: Larey Seat on: 07/21/2017 05:00 PM   Modules accepted: Orders

## 2017-07-21 NOTE — Telephone Encounter (Signed)
Due on 07-24-2017 , I will refill for one week.

## 2017-07-21 NOTE — Telephone Encounter (Signed)
Patient requesting refill of methadone (DOLOPHINE) 10 MG tablet and oxyCODONE (ROXICODONE) 15 MG immediate release tablet. I advised Dr. Jannifer Franklin is out of the office and will send to his nurse.

## 2017-07-22 NOTE — Telephone Encounter (Signed)
Pt has called re: the amount of pills he was given.  The entry from Dr Brett Fairy was read to him that he was intentionally given a 7 day supply.  Pt would like a call back advising him when to call back for additional pills

## 2017-07-22 NOTE — Telephone Encounter (Signed)
rx placed up front for pick up 

## 2017-07-22 NOTE — Telephone Encounter (Signed)
Called and spoke with patient. Advised Dr Brett Fairy only did enough to get him through until Dr Jannifer Franklin back in the office.  Will send message to Dr Jannifer Franklin to refill once he is back in the office. He verbalized understanding.

## 2017-07-29 MED ORDER — METHADONE HCL 10 MG PO TABS: ORAL_TABLET | ORAL | 0 refills | Status: DC

## 2017-07-29 MED ORDER — OXYCODONE HCL 15 MG PO TABS: 15.0000 mg | ORAL_TABLET | Freq: Four times a day (QID) | ORAL | 0 refills | Status: DC | PRN

## 2017-07-29 NOTE — Telephone Encounter (Signed)
A prescription for methadone and oxycodone written today.

## 2017-07-29 NOTE — Addendum Note (Signed)
Addended by: Kathrynn Ducking on: 07/29/2017 02:43 PM   Modules accepted: Orders

## 2017-07-29 NOTE — Telephone Encounter (Signed)
Pt calling back because as stated in previous message pt is out of the medications.  He states that he lives 45 mins away and he has court tomorrow and not nowing how long that will last he would like to be able to get this today if at all possible.  Please call

## 2017-07-29 NOTE — Telephone Encounter (Signed)
Patient called office in reference to Methadone and Oxycodone to see if Dr. Jannifer Franklin is able to fill the rest of the medication today.  Patient will be taking his last pills today.  Please call

## 2017-07-29 NOTE — Telephone Encounter (Signed)
Called patient. Advised rx ready for pick up. Pt verbalized understanding and appreciation for call.

## 2017-08-25 ENCOUNTER — Telehealth: Payer: Self-pay | Admitting: Neurology

## 2017-08-25 DIAGNOSIS — G894 Chronic pain syndrome: Secondary | ICD-10-CM

## 2017-08-25 NOTE — Telephone Encounter (Signed)
The medication refills are due on 08/26/2017.  I will refill them then.

## 2017-08-25 NOTE — Telephone Encounter (Signed)
Pt calling for refills of   methadone (DOLOPHINE) 10 MG tablet    oxyCODONE (ROXICODONE) 15 MG immediate release tablet

## 2017-08-26 MED ORDER — OXYCODONE HCL 15 MG PO TABS: 15.0000 mg | ORAL_TABLET | Freq: Four times a day (QID) | ORAL | 0 refills | Status: DC | PRN

## 2017-08-26 MED ORDER — METHADONE HCL 10 MG PO TABS: ORAL_TABLET | ORAL | 0 refills | Status: DC

## 2017-08-26 NOTE — Telephone Encounter (Signed)
Rxs are at the front for patient to pick up.

## 2017-08-26 NOTE — Addendum Note (Signed)
Addended by: Kathrynn Ducking on: 08/26/2017 08:12 AM   Modules accepted: Orders

## 2017-09-01 DIAGNOSIS — R079 Chest pain, unspecified: Secondary | ICD-10-CM | POA: Diagnosis not present

## 2017-09-01 DIAGNOSIS — J441 Chronic obstructive pulmonary disease with (acute) exacerbation: Secondary | ICD-10-CM | POA: Diagnosis not present

## 2017-09-01 DIAGNOSIS — F17210 Nicotine dependence, cigarettes, uncomplicated: Secondary | ICD-10-CM | POA: Diagnosis not present

## 2017-09-01 DIAGNOSIS — R0602 Shortness of breath: Secondary | ICD-10-CM | POA: Diagnosis not present

## 2017-09-01 DIAGNOSIS — R42 Dizziness and giddiness: Secondary | ICD-10-CM | POA: Diagnosis not present

## 2017-09-03 ENCOUNTER — Ambulatory Visit: Payer: Self-pay | Admitting: Adult Health

## 2017-09-03 DIAGNOSIS — Z09 Encounter for follow-up examination after completed treatment for conditions other than malignant neoplasm: Secondary | ICD-10-CM | POA: Diagnosis not present

## 2017-09-03 DIAGNOSIS — J309 Allergic rhinitis, unspecified: Secondary | ICD-10-CM | POA: Diagnosis not present

## 2017-09-03 DIAGNOSIS — I10 Essential (primary) hypertension: Secondary | ICD-10-CM | POA: Diagnosis not present

## 2017-09-03 DIAGNOSIS — I2699 Other pulmonary embolism without acute cor pulmonale: Secondary | ICD-10-CM | POA: Diagnosis not present

## 2017-09-22 ENCOUNTER — Telehealth: Payer: Self-pay | Admitting: Neurology

## 2017-09-22 DIAGNOSIS — G894 Chronic pain syndrome: Secondary | ICD-10-CM

## 2017-09-22 NOTE — Telephone Encounter (Signed)
Called pt. Advised since CW,MD out of office today, Dr Jannifer Franklin will address refill request once he returns to office. Pt requesting to pick up tomorrow if possible.

## 2017-09-22 NOTE — Telephone Encounter (Signed)
Patient requesting refill of methadone (DOLOPHINE) 10 MG tablet and methadone (DOLOPHINE) 10 MG tablet. I advised Dr. Jannifer Franklin is out of the office today and will send to his nurse.

## 2017-09-23 MED ORDER — OXYCODONE HCL 15 MG PO TABS: 15.0000 mg | ORAL_TABLET | Freq: Four times a day (QID) | ORAL | 0 refills | Status: DC | PRN

## 2017-09-23 MED ORDER — METHADONE HCL 10 MG PO TABS: ORAL_TABLET | ORAL | 0 refills | Status: DC

## 2017-09-23 NOTE — Telephone Encounter (Signed)
The oxycodone and methadone will be refilled today.  The [facility] registry was checked.

## 2017-09-23 NOTE — Telephone Encounter (Signed)
Placed printed/signed rx's up front for patient pick up.

## 2017-09-23 NOTE — Addendum Note (Signed)
Addended by: Kathrynn Ducking on: 09/23/2017 08:08 AM   Modules accepted: Orders

## 2017-10-06 ENCOUNTER — Encounter: Payer: Self-pay | Admitting: Adult Health

## 2017-10-06 ENCOUNTER — Encounter (INDEPENDENT_AMBULATORY_CARE_PROVIDER_SITE_OTHER): Payer: Self-pay

## 2017-10-06 ENCOUNTER — Ambulatory Visit (INDEPENDENT_AMBULATORY_CARE_PROVIDER_SITE_OTHER): Payer: PPO | Admitting: Adult Health

## 2017-10-06 VITALS — BP 167/83 | HR 56 | Wt 173.6 lb

## 2017-10-06 DIAGNOSIS — G8929 Other chronic pain: Secondary | ICD-10-CM | POA: Diagnosis not present

## 2017-10-06 DIAGNOSIS — F1190 Opioid use, unspecified, uncomplicated: Secondary | ICD-10-CM | POA: Diagnosis not present

## 2017-10-06 DIAGNOSIS — M5441 Lumbago with sciatica, right side: Secondary | ICD-10-CM

## 2017-10-06 NOTE — Progress Notes (Signed)
PATIENT: Randy Chase DOB: Jan 13, 1951  REASON FOR VISIT: follow up-back pain HISTORY FROM: patient  HISTORY OF PRESENT ILLNESS:  Today 10/06/17 Randy Chase is a 66 year old male with a history of chronic back pain.  He returns today for follow-up.  He remains on methadone and oxycodone for his back pain.  He states that this makes his pain more tolerable.  He states he continues to have right low back pain that radiates down the right leg.  He also reports some numbness in the right leg.  He reports recently he has noticed some pain in the left leg as well.  He states that as long as he wears boots his balance is steady.  He denies any recent falls.  He does report that he recently went to the emergency room at [facility] for difficulty breathing.  He reports he was told he had a blood clot however upon follow-up with his primary care this was questioned.  He states he is supposed to have a follow-up with his primary care this week.  He returns today for an evaluation.   REVIEW OF SYSTEMS: Out of a complete 14 system review of symptoms, the patient complains only of the following symptoms, and all other reviewed systems are negative.  Fatigue, he ringing in ears, shortness of breath, leg swelling, daytime sleepiness, frequency of urination, joint pain, back pain, walking difficulty, neck pain, neck stiffness, nervous/anxious, numbness, dizziness, bruise/bleed easily, environmental allergies, frequency of urination  ALLERGIES: Allergies  Allergen Reactions  . Reglan [Metoclopramide]     HOME MEDICATIONS: Outpatient Medications Prior to Visit  Medication Sig Dispense Refill  . albuterol (PROVENTIL HFA;VENTOLIN HFA) 108 (90 Base) MCG/ACT inhaler Inhale every 6 (six) hours as needed into the lungs for wheezing or shortness of breath.    Marland Kitchen atenolol (TENORMIN) 25 MG tablet Take daily by mouth.    . fluticasone (FLONASE) 50 MCG/ACT nasal spray Place daily into both nostrils.    Marland Kitchen  ibuprofen (ADVIL,MOTRIN) 200 MG tablet Take 200 mg by mouth 2 (two) times daily as needed for pain.    Marland Kitchen loratadine (CLARITIN) 10 MG tablet Take 10 mg by mouth daily.    . methadone (DOLOPHINE) 10 MG tablet Take 2 tablets in the morning and 3 tablets in the evening 150 tablet 0  . oxyCODONE (ROXICODONE) 15 MG immediate release tablet Take 1 tablet (15 mg total) by mouth every 6 (six) hours as needed. 120 tablet 0  . Fluoxetine HCl, PMDD, 10 MG TABS Take 1 tablet (10 mg total) by mouth daily. (Patient not taking: Reported on 10/06/2017) 30 each 5   No facility-administered medications prior to visit.     PAST MEDICAL HISTORY: Past Medical History:  Diagnosis Date  . Anxiety   . Arthritis   . Bladder cancer ([place])   . Colitis   . Low back pain     PAST SURGICAL HISTORY: Past Surgical History:  Procedure Laterality Date  . bladder cancer    . HERNIA REPAIR     Right inguinal  . SPLENIC ARTERY EMBOLIZATION     Splenic resection    FAMILY HISTORY: Family History  Problem Relation Age of Onset  . Colon cancer Mother     SOCIAL HISTORY: Social History   Socioeconomic History  . Marital status: Married    Spouse name: Not on file  . Number of children: 1  . Years of education: 42  . Highest education level: Not on file  Social Needs  .  Financial resource strain: Not on file  . Food insecurity - worry: Not on file  . Food insecurity - inability: Not on file  . Transportation needs - medical: Not on file  . Transportation needs - non-medical: Not on file  Occupational History  . Not on file  Tobacco Use  . Smoking status: Current Every Day Smoker    Packs/day: 0.25    Types: Cigarettes  . Smokeless tobacco: Never Used  Substance and Sexual Activity  . Alcohol use: Yes    Alcohol/week: 16.8 oz    Types: 20 Cans of beer, 8 Standard drinks or equivalent per week    Comment: drinks 12 packs a week  . Drug use: No  . Sexual activity: Not on file  Other Topics Concern    . Not on file  Social History Narrative   Patient is right handed.    Patient does not drink caffeine.      PHYSICAL EXAM  Vitals:   10/06/17 1336  BP: (!) 167/83  Pulse: (!) 56  Weight: 173 lb 9.6 oz (78.7 kg)   Body mass index is 25.64 kg/m.  Generalized: Well developed, in no acute distress   Neurological examination  Mentation: Alert oriented to time, place, history taking. Follows all commands speech and language fluent Cranial nerve II-XII: Pupils were equal round reactive to light. Extraocular movements were full, visual field were full on confrontational test. Facial sensation and strength were normal. Uvula tongue midline. Head turning and shoulder shrug  were normal and symmetric. Motor: The motor testing reveals 5 over 5 strength of all 4 extremities. Good symmetric motor tone is noted throughout.  Sensory: Sensory testing is intact to soft touch on all 4 extremities. No evidence of extinction is noted.  Coordination: Cerebellar testing reveals good finger-nose-finger and heel-to-shin bilaterally.  Gait and station: Gait is normal. Tandem gait is normal. Romberg is negative. No drift is seen.  Reflexes: Deep tendon reflexes are symmetric and normal bilaterally.   DIAGNOSTIC DATA (LABS, IMAGING, TESTING) - I reviewed patient records, labs, notes, testing and imaging myself where available.     ASSESSMENT AND PLAN 66 y.o. year old male  has a past medical history of Anxiety, Arthritis, Bladder cancer ([place]), Colitis, and Low back pain. here with:  1. Chronic back pain   Overall the patient is doing well.  He will continue on oxycodone and methadone for chronic back pain.  I did check the [place]  drug registry and the patient is not receiving any other opioid medication.  I will check a urine drug screen today.  He is advised that if his symptoms worsen or he develops new symptoms he should let us know.  He will follow-up in 6 months or sooner if needed.   Ward Givens, MSN, NP-C 10/06/2017, 1:40 PM [facility] [address], [place], [place] [address] [phone]

## 2017-10-06 NOTE — Patient Instructions (Signed)
Your Plan:  Continue methadone and oxycodone for pain.  Urine drug screen today If your symptoms worsen or you develop new symptoms please let us know.   Thank you for coming to see [place] at [facility]. I hope we have been able to provide you high quality care today.  You may receive a patient satisfaction survey over the next few weeks. We would appreciate your feedback and comments so that we may continue to improve ourselves and the health of our patients.

## 2017-10-06 NOTE — Progress Notes (Signed)
I have read the note, and I agree with the clinical assessment and plan.  Myra Weng KEITH   

## 2017-10-07 DIAGNOSIS — F17200 Nicotine dependence, unspecified, uncomplicated: Secondary | ICD-10-CM | POA: Diagnosis not present

## 2017-10-07 DIAGNOSIS — I10 Essential (primary) hypertension: Secondary | ICD-10-CM | POA: Diagnosis not present

## 2017-10-07 DIAGNOSIS — Z6825 Body mass index (BMI) 25.0-25.9, adult: Secondary | ICD-10-CM | POA: Diagnosis not present

## 2017-10-07 DIAGNOSIS — J449 Chronic obstructive pulmonary disease, unspecified: Secondary | ICD-10-CM | POA: Diagnosis not present

## 2017-10-07 LAB — MED LIST OPTION NOT SELECTED

## 2017-10-09 ENCOUNTER — Telehealth: Payer: Self-pay | Admitting: *Deleted

## 2017-10-09 LAB — DRUG SCREEN, UR (12+OXYCODONE+CRT)
AMPHETAMINE SCREEN URINE: NEGATIVE ng/mL
BARBITURATE SCREEN URINE: NEGATIVE ng/mL
BENZODIAZEPINE SCREEN, URINE: NEGATIVE ng/mL
CANNABINOIDS UR QL SCN: NEGATIVE ng/mL
Cocaine (Metab) Scrn, Ur: NEGATIVE ng/mL
Creatinine(Crt), U: 65.6 mg/dL (ref 20.0–300.0)
FENTANYL, URINE: NEGATIVE pg/mL
Meperidine Screen, Urine: NEGATIVE ng/mL
Methadone Screen, Urine: POSITIVE — AB
OXYCODONE+OXYMORPHONE UR QL SCN: POSITIVE — AB
Opiate Scrn, Ur: NEGATIVE ng/mL
PH UR, DRUG SCRN: 6 (ref 4.5–8.9)
Phencyclidine Qn, Ur: NEGATIVE ng/mL
Propoxyphene Scrn, Ur: NEGATIVE ng/mL
SPECIFIC GRAVITY: 1.009
Tramadol Screen, Urine: NEGATIVE ng/mL

## 2017-10-09 NOTE — Telephone Encounter (Signed)
Spoke with patient and informed him that his lab work is unremarkable. He verbalized understanding, appreciation.

## 2017-10-20 ENCOUNTER — Telehealth: Payer: Self-pay | Admitting: Neurology

## 2017-10-20 DIAGNOSIS — G894 Chronic pain syndrome: Secondary | ICD-10-CM

## 2017-10-20 NOTE — Telephone Encounter (Signed)
The oxycodone and methadone will be due on 21 October 2017.

## 2017-10-20 NOTE — Telephone Encounter (Signed)
Patient requesting refill of methadone (DOLOPHINE) 10 MG tablet and oxyCODONE (ROXICODONE) 15 MG immediate release tablet.

## 2017-10-21 MED ORDER — METHADONE HCL 10 MG PO TABS: ORAL_TABLET | ORAL | 0 refills | Status: DC

## 2017-10-21 MED ORDER — OXYCODONE HCL 15 MG PO TABS: 15.0000 mg | ORAL_TABLET | Freq: Four times a day (QID) | ORAL | 0 refills | Status: DC | PRN

## 2017-10-21 NOTE — Addendum Note (Signed)
Addended by: Kathrynn Ducking on: 10/21/2017 07:21 AM   Modules accepted: Orders

## 2017-10-21 NOTE — Telephone Encounter (Signed)
Placed printed/signed rx up front for patient pick up.  

## 2017-10-29 DIAGNOSIS — I10 Essential (primary) hypertension: Secondary | ICD-10-CM | POA: Diagnosis not present

## 2017-10-29 DIAGNOSIS — F411 Generalized anxiety disorder: Secondary | ICD-10-CM | POA: Diagnosis not present

## 2017-10-29 DIAGNOSIS — R600 Localized edema: Secondary | ICD-10-CM | POA: Diagnosis not present

## 2017-10-29 DIAGNOSIS — I4891 Unspecified atrial fibrillation: Secondary | ICD-10-CM | POA: Diagnosis not present

## 2017-10-29 DIAGNOSIS — J984 Other disorders of lung: Secondary | ICD-10-CM | POA: Diagnosis not present

## 2017-10-29 DIAGNOSIS — Z6827 Body mass index (BMI) 27.0-27.9, adult: Secondary | ICD-10-CM | POA: Diagnosis not present

## 2017-10-30 ENCOUNTER — Telehealth: Payer: Self-pay | Admitting: Adult Health

## 2017-10-30 NOTE — Telephone Encounter (Signed)
Pt called he is having problems with his heart. His cardiologist is wanting to prescribe ativan for anxiety and he needs an approval from provider it is ok. Please call to advise

## 2017-10-30 NOTE — Telephone Encounter (Signed)
I called the patient. He got a prescription for ativan through the ER. He contacted me to let me know,as to not break controlled substance agreement with our office. OK to take the ativan.

## 2017-11-10 ENCOUNTER — Other Ambulatory Visit: Payer: Self-pay

## 2017-11-10 DIAGNOSIS — I10 Essential (primary) hypertension: Secondary | ICD-10-CM

## 2017-11-10 DIAGNOSIS — F419 Anxiety disorder, unspecified: Secondary | ICD-10-CM | POA: Insufficient documentation

## 2017-11-10 DIAGNOSIS — R002 Palpitations: Secondary | ICD-10-CM

## 2017-11-10 DIAGNOSIS — R609 Edema, unspecified: Secondary | ICD-10-CM

## 2017-11-10 DIAGNOSIS — R0602 Shortness of breath: Secondary | ICD-10-CM | POA: Insufficient documentation

## 2017-11-10 DIAGNOSIS — R42 Dizziness and giddiness: Secondary | ICD-10-CM | POA: Insufficient documentation

## 2017-11-10 HISTORY — DX: Essential (primary) hypertension: I10

## 2017-11-10 HISTORY — DX: Palpitations: R00.2

## 2017-11-10 HISTORY — DX: Dizziness and giddiness: R42

## 2017-11-10 HISTORY — DX: Edema, unspecified: R60.9

## 2017-11-10 HISTORY — DX: Shortness of breath: R06.02

## 2017-11-11 ENCOUNTER — Ambulatory Visit (INDEPENDENT_AMBULATORY_CARE_PROVIDER_SITE_OTHER): Payer: PPO | Admitting: Cardiology

## 2017-11-11 ENCOUNTER — Encounter: Payer: Self-pay | Admitting: Cardiology

## 2017-11-11 VITALS — BP 132/60 | HR 76 | Ht 69.0 in | Wt 178.4 lb

## 2017-11-11 DIAGNOSIS — I480 Paroxysmal atrial fibrillation: Secondary | ICD-10-CM | POA: Diagnosis not present

## 2017-11-11 DIAGNOSIS — I10 Essential (primary) hypertension: Secondary | ICD-10-CM | POA: Diagnosis not present

## 2017-11-11 DIAGNOSIS — I2510 Atherosclerotic heart disease of native coronary artery without angina pectoris: Secondary | ICD-10-CM

## 2017-11-11 DIAGNOSIS — R0602 Shortness of breath: Secondary | ICD-10-CM | POA: Diagnosis not present

## 2017-11-11 DIAGNOSIS — F17210 Nicotine dependence, cigarettes, uncomplicated: Secondary | ICD-10-CM

## 2017-11-11 HISTORY — DX: Paroxysmal atrial fibrillation: I48.0

## 2017-11-11 HISTORY — DX: Nicotine dependence, cigarettes, uncomplicated: F17.210

## 2017-11-11 HISTORY — DX: Atherosclerotic heart disease of native coronary artery without angina pectoris: I25.10

## 2017-11-11 MED ORDER — ASPIRIN EC 325 MG PO TBEC: 325.0000 mg | DELAYED_RELEASE_TABLET | Freq: Every day | ORAL | 10 refills | Status: DC

## 2017-11-11 MED ORDER — ATORVASTATIN CALCIUM 10 MG PO TABS: 10.0000 mg | ORAL_TABLET | Freq: Every day | ORAL | 0 refills | Status: DC

## 2017-11-11 NOTE — Progress Notes (Signed)
Cardiology Office Note:    Date:  11/11/2017   ID:  Randy Chase, DOB Aug 18, 1951, MRN [id number]  PCP:  Jeanie Sewer, NP  Cardiologist:  Jenean Lindau, MD   Referring MD: Jeanie Sewer, NP    ASSESSMENT:    1. Paroxysmal atrial fibrillation (HCC)   2. Essential hypertension   3. Shortness of breath   4. Atherosclerosis of native coronary artery of native heart without angina pectoris   5. Cigarette smoker    PLAN:    In order of problems listed above:  Secondary prevention stressed with the patient.  Importance of compliance with diet and medications stressed and he vocalized understanding. I discussed with him coronary calcifications and that he needs statin therapy and aspirin therapy on a regular basis.  Benefits and potential risks were explained and he vocalized understanding and is agreeable.  I will initiate him on atorvastatin 10 mg daily.  Liver lipid check will be done in 6 weeks.  Benefits and potential risks explained.  I told him to abstain from alcohol abuse and used it minimally. In view of shortness of breath and coronary calcifications and multiple risk factors for coronary artery disease he will undergo exercise stress Cardiolite. Echocardiogram will be done to assess murmur heard on auscultation. He will be back in 5-6 weeks for follow-up appointment at which time will do a fasting liver lipid check. I spent 5 minutes with the patient discussing solely about smoking. Smoking cessation was counseled. I suggested to the patient also different medications and pharmacological interventions. Patient is keen to try stopping on its own at this time. He will get back to me if he needs any further assistance in this matter.    Medication Adjustments/Labs and Tests Ordered: Current medicines are reviewed at length with the patient today.  Concerns regarding medicines are outlined above.  No orders of the defined types were placed in this encounter.  No  orders of the defined types were placed in this encounter.    History of Present Illness:    Randy Chase is a 66 y.o. male who is being seen today for the evaluation of atrial fibrillation, paroxysmal in nature, At the request of Jeanie Sewer, NP.  This patient is a pleasant 66 year old male.  He has past medical history of essential hypertension.  He was found to have paroxysmal atrial fibrillation.  He leads a sedentary lifestyle.  He complains of chronic shortness of breath on exertion he mentions to me that he was smoking more than a pack a day and subsequently has cut down smoking.  He went to the hospital with shortness of breath and a CT scan revealed that he has calcifications of the coronary arteries.  Pulmonary embolism was ruled out.  Again he denies any chest pain or chest tightness.  No orthopnea PND or any syncope.  At the time of my evaluation, the patient is alert awake oriented and in no distress.  Past Medical History:  Diagnosis Date  . Allergic rhinitis   . Anxiety   . Arthritis   . Bladder cancer ([place])   . BMI 25.0-25.9,adult   . Colitis   . Colonic polyp   . Hematuria   . Hernia, inguinal, right   . Low back pain   . Lumbar disc narrowing   . Tobacco use     Past Surgical History:  Procedure Laterality Date  . bladder cancer    . HERNIA REPAIR     Right inguinal  .  SPLENIC ARTERY EMBOLIZATION     Splenic resection    Current Medications: Current Meds  Medication Sig  . albuterol (PROAIR HFA) 108 (90 Base) MCG/ACT inhaler Inhale 2 puffs into the lungs every 6 (six) hours as needed for wheezing or shortness of breath.  . busPIRone (BUSPAR) 5 MG tablet Take 10 mg by mouth 3 (three) times daily.  Marland Kitchen diltiazem (CARDIZEM) 60 MG tablet Take 60 mg by mouth 3 (three) times daily.  . Fluoxetine HCl, PMDD, 10 MG TABS Take 1 tablet (10 mg total) by mouth daily.  . fluticasone (FLONASE) 50 MCG/ACT nasal spray Place daily into both nostrils.  Marland Kitchen ibuprofen  (ADVIL,MOTRIN) 200 MG tablet Take 200 mg by mouth 2 (two) times daily as needed for pain.  Marland Kitchen loratadine (CLARITIN) 10 MG tablet Take 10 mg by mouth daily.  . methadone (DOLOPHINE) 10 MG tablet Take 2 tablets in the morning and 3 tablets in the evening  . oxyCODONE (ROXICODONE) 15 MG immediate release tablet Take 1 tablet (15 mg total) by mouth every 6 (six) hours as needed.  . umeclidinium-vilanterol (ANORO ELLIPTA) 62.5-25 MCG/INH AEPB Inhale 1 puff into the lungs daily.     Allergies:   Reglan [metoclopramide]   Social History   Socioeconomic History  . Marital status: Married    Spouse name: None  . Number of children: 1  . Years of education: 3  . Highest education level: None  Social Needs  . Financial resource strain: None  . Food insecurity - worry: None  . Food insecurity - inability: None  . Transportation needs - medical: None  . Transportation needs - non-medical: None  Occupational History  . None  Tobacco Use  . Smoking status: Current Every Day Smoker    Packs/day: 0.25    Types: Cigarettes  . Smokeless tobacco: Never Used  Substance and Sexual Activity  . Alcohol use: Yes    Alcohol/week: 16.8 oz    Types: 20 Cans of beer, 8 Standard drinks or equivalent per week    Comment: drinks 12 packs a week  . Drug use: No  . Sexual activity: None  Other Topics Concern  . None  Social History Narrative   Patient is right handed.    Patient does not drink caffeine.     Family History: The patient's family history includes Arthritis in his father and mother; Asthma in his father; Colon cancer in his mother; Stomach cancer in his paternal grandfather.  ROS:   Please see the history of present illness.    All other systems reviewed and are negative.  EKGs/Labs/Other Studies Reviewed:    Patient's EKG reveals sinus rhythm nonspecific ST-T changes and also he has brought along EKGs with which revealed atrial fibrillation with well-controlled ventricular rate.   Records from primary care physician were reviewed extensively and I discussed findings with the patient and his questions were answered to his satisfaction.  Recent Labs: No results found for requested labs within last 8760 hours.  Recent Lipid Panel No results found for: CHOL, TRIG, HDL, CHOLHDL, VLDL, LDLCALC, LDLDIRECT  Physical Exam:    VS:  BP 132/60   Pulse 76   Ht 5\' 9"  (1.753 m)   Wt 178 lb 6.4 oz (80.9 kg)   SpO2 96%   BMI 26.35 kg/m     Wt Readings from Last 3 Encounters:  11/11/17 178 lb 6.4 oz (80.9 kg)  10/06/17 173 lb 9.6 oz (78.7 kg)  03/04/17 159 lb (72.1 kg)  GEN: Patient is in no acute distress HEENT: Normal NECK: No JVD; No carotid bruits LYMPHATICS: No lymphadenopathy CARDIAC: S1 S2 regular, 2/6 systolic murmur at the apex. RESPIRATORY:  Clear to auscultation without rales, wheezing or rhonchi  ABDOMEN: Soft, non-tender, non-distended MUSCULOSKELETAL:  No edema; No deformity  SKIN: Warm and dry NEUROLOGIC:  Alert and oriented x 3 PSYCHIATRIC:  Normal affect    Signed, Jenean Lindau, MD  11/11/2017 2:58 PM    [place]

## 2017-11-11 NOTE — Patient Instructions (Addendum)
Medication Instructions:  Your physician has recommended you make the following change in your medication:  START atorvastatin 10 mg at night START enteric coated aspirin 325 mg daily  Labwork: Your physician recommends that you have the following labs drawn: Liver and lipid panel at follow up- come fasting  Testing/Procedures: Your physician has requested that you have an echocardiogram. Echocardiography is a painless test that uses sound waves to create images of your heart. It provides your doctor with information about the size and shape of your heart and how well your heart's chambers and valves are working. This procedure takes approximately one hour. There are no restrictions for this procedure.  Your physician has requested that you have a lexiscan myoview. For further information please visit [email or web address]. Please follow instruction sheet, as given.  Follow-Up: Your physician recommends that you schedule a follow-up appointment in: 1 month  Any Other Special Instructions Will Be Listed Below (If Applicable).     If you need a refill on your cardiac medications before your next appointment, please call your pharmacy.   [place], RN, BSN   Echocardiogram An echocardiogram, or echocardiography, uses sound waves (ultrasound) to produce an image of your heart. The echocardiogram is simple, painless, obtained within a short period of time, and offers valuable information to your health care provider. The images from an echocardiogram can provide information such as:  Evidence of coronary artery disease (CAD).  Heart size.  Heart muscle function.  Heart valve function.  Aneurysm detection.  Evidence of a past heart attack.  Fluid buildup around the heart.  Heart muscle thickening.  Assess heart valve function.  Tell a health care provider about:  Any allergies you have.  All medicines you are taking, including vitamins, herbs, eye drops,  creams, and over-the-counter medicines.  Any problems you or family members have had with anesthetic medicines.  Any blood disorders you have.  Any surgeries you have had.  Any medical conditions you have.  Whether you are pregnant or may be pregnant. What happens before the procedure? No special preparation is needed. Eat and drink normally. What happens during the procedure?  In order to produce an image of your heart, gel will be applied to your chest and a wand-like tool (transducer) will be moved over your chest. The gel will help transmit the sound waves from the transducer. The sound waves will harmlessly bounce off your heart to allow the heart images to be captured in real-time motion. These images will then be recorded.  You may need an IV to receive a medicine that improves the quality of the pictures. What happens after the procedure? You may return to your normal schedule including diet, activities, and medicines, unless your health care provider tells you otherwise. This information is not intended to replace advice given to you by your health care provider. Make sure you discuss any questions you have with your health care provider. Document Released: 11/08/2000 Document Revised: 06/29/2016 Document Reviewed: 07/19/2013 Elsevier Interactive Patient Education  2017 [employer].

## 2017-11-13 ENCOUNTER — Telehealth: Payer: Self-pay | Admitting: Neurology

## 2017-11-13 DIAGNOSIS — G894 Chronic pain syndrome: Secondary | ICD-10-CM

## 2017-11-13 DIAGNOSIS — I4891 Unspecified atrial fibrillation: Secondary | ICD-10-CM | POA: Diagnosis not present

## 2017-11-13 DIAGNOSIS — F411 Generalized anxiety disorder: Secondary | ICD-10-CM | POA: Diagnosis not present

## 2017-11-13 DIAGNOSIS — Z6827 Body mass index (BMI) 27.0-27.9, adult: Secondary | ICD-10-CM | POA: Diagnosis not present

## 2017-11-13 NOTE — Telephone Encounter (Signed)
Pt request refill for methadone (DOLOPHINE) 10 MG tablet and oxyCODONE (ROXICODONE) 15 MG immediate release tablet . Pt said he is due refill on 12/25, office and pharmacy will be closed. Please call to advise

## 2017-11-13 NOTE — Telephone Encounter (Signed)
The [facility] registry was checked.  The medications are not due on 18 November 2017.  The office is open on 26 December, the prescriptions will be written at that time.

## 2017-11-19 MED ORDER — OXYCODONE HCL 15 MG PO TABS: 15.0000 mg | ORAL_TABLET | Freq: Four times a day (QID) | ORAL | 0 refills | Status: DC | PRN

## 2017-11-19 MED ORDER — METHADONE HCL 10 MG PO TABS: ORAL_TABLET | ORAL | 0 refills | Status: DC

## 2017-11-19 NOTE — Telephone Encounter (Signed)
Placed printed/signed rx up front for patient pick up.  

## 2017-11-24 ENCOUNTER — Telehealth (HOSPITAL_COMMUNITY): Payer: Self-pay | Admitting: *Deleted

## 2017-11-24 NOTE — Telephone Encounter (Signed)
Left message on voicemail per DPR in reference to upcoming appointment scheduled on 11/28/17 at [facility] with detailed instructions given per Myocardial Perfusion Study Information Sheet for the test. LM to arrive 15 minutes early, and that it is imperative to arrive on time for appointment to keep from having the test rescheduled. If you need to cancel or reschedule your appointment, please call the office within 24 hours of your appointment. Failure to do so may result in a cancellation of your appointment, and a $50 no show fee. Phone number given for call back for any questions.

## 2017-11-27 DIAGNOSIS — Z6826 Body mass index (BMI) 26.0-26.9, adult: Secondary | ICD-10-CM | POA: Diagnosis not present

## 2017-11-27 DIAGNOSIS — Z1331 Encounter for screening for depression: Secondary | ICD-10-CM | POA: Diagnosis not present

## 2017-11-27 DIAGNOSIS — J0100 Acute maxillary sinusitis, unspecified: Secondary | ICD-10-CM | POA: Diagnosis not present

## 2017-11-28 ENCOUNTER — Encounter (HOSPITAL_COMMUNITY): Payer: PPO

## 2017-11-28 ENCOUNTER — Other Ambulatory Visit (HOSPITAL_COMMUNITY): Payer: PPO

## 2017-12-02 ENCOUNTER — Telehealth (HOSPITAL_COMMUNITY): Payer: Self-pay | Admitting: *Deleted

## 2017-12-02 NOTE — Telephone Encounter (Signed)
Left message on voicemail per DPR in reference to upcoming appointment scheduled on 12/05/17 at 1130 with detailed instructions given per Myocardial Perfusion Study Information Sheet for the test. LM to arrive 15 minutes early, and that it is imperative to arrive on time for appointment to keep from having the test rescheduled. If you need to cancel or reschedule your appointment, please call the office within 24 hours of your appointment. Failure to do so may result in a cancellation of your appointment, and a $50 no show fee. Phone number given for call back for any questions.

## 2017-12-05 ENCOUNTER — Other Ambulatory Visit: Payer: Self-pay

## 2017-12-05 ENCOUNTER — Ambulatory Visit (HOSPITAL_BASED_OUTPATIENT_CLINIC_OR_DEPARTMENT_OTHER): Payer: PPO

## 2017-12-05 ENCOUNTER — Ambulatory Visit (HOSPITAL_COMMUNITY): Payer: PPO | Attending: Cardiology

## 2017-12-05 DIAGNOSIS — I10 Essential (primary) hypertension: Secondary | ICD-10-CM | POA: Diagnosis not present

## 2017-12-05 DIAGNOSIS — Z720 Tobacco use: Secondary | ICD-10-CM | POA: Insufficient documentation

## 2017-12-05 DIAGNOSIS — I480 Paroxysmal atrial fibrillation: Secondary | ICD-10-CM | POA: Diagnosis not present

## 2017-12-05 DIAGNOSIS — R0602 Shortness of breath: Secondary | ICD-10-CM | POA: Diagnosis not present

## 2017-12-05 LAB — MYOCARDIAL PERFUSION IMAGING
CHL CUP NUCLEAR SRS: 1
CHL CUP RESTING HR STRESS: 79 {beats}/min
LHR: 0.31
LV sys vol: 54 mL
LVDIAVOL: 133 mL (ref 62–150)
NUC STRESS TID: 1.01
Peak HR: 106 {beats}/min
SDS: 1
SSS: 2

## 2017-12-05 MED ORDER — REGADENOSON 0.4 MG/5ML IV SOLN
0.4000 mg | Freq: Once | INTRAVENOUS | Status: AC
  Administered 2017-12-05: 0.4 mg via INTRAVENOUS

## 2017-12-05 MED ORDER — TECHNETIUM TC 99M TETROFOSMIN IV KIT
30.9000 | PACK | Freq: Once | INTRAVENOUS | Status: AC | PRN
  Administered 2017-12-05: 30.9 via INTRAVENOUS
  Filled 2017-12-05: qty 31

## 2017-12-05 MED ORDER — TECHNETIUM TC 99M TETROFOSMIN IV KIT
10.1000 | PACK | Freq: Once | INTRAVENOUS | Status: AC | PRN
  Administered 2017-12-05: 10.1 via INTRAVENOUS
  Filled 2017-12-05: qty 11

## 2017-12-10 DIAGNOSIS — J019 Acute sinusitis, unspecified: Secondary | ICD-10-CM | POA: Diagnosis not present

## 2017-12-10 DIAGNOSIS — Z6827 Body mass index (BMI) 27.0-27.9, adult: Secondary | ICD-10-CM | POA: Diagnosis not present

## 2017-12-10 DIAGNOSIS — I10 Essential (primary) hypertension: Secondary | ICD-10-CM | POA: Diagnosis not present

## 2017-12-12 ENCOUNTER — Ambulatory Visit: Payer: PPO | Admitting: Cardiology

## 2017-12-16 ENCOUNTER — Telehealth: Payer: Self-pay | Admitting: Neurology

## 2017-12-16 DIAGNOSIS — G894 Chronic pain syndrome: Secondary | ICD-10-CM

## 2017-12-16 NOTE — Telephone Encounter (Signed)
Pt calling for a refill Rx on methadone (DOLOPHINE) 10 MG tablet and oxyCODONE (ROXICODONE) 15 MG immediate release tablet, no changes to insurance

## 2017-12-16 NOTE — Telephone Encounter (Signed)
The prescription for methadone is due on 17 December 2017.

## 2017-12-17 MED ORDER — METHADONE HCL 10 MG PO TABS: ORAL_TABLET | ORAL | 0 refills | Status: DC

## 2017-12-17 MED ORDER — OXYCODONE HCL 15 MG PO TABS: 15.0000 mg | ORAL_TABLET | Freq: Four times a day (QID) | ORAL | 0 refills | Status: DC | PRN

## 2017-12-17 NOTE — Addendum Note (Signed)
Addended by: Kathrynn Ducking on: 12/17/2017 07:26 AM   Modules accepted: Orders

## 2017-12-17 NOTE — Telephone Encounter (Signed)
Placed printed/signed rx's up front for patient pick up.

## 2017-12-19 ENCOUNTER — Encounter: Payer: Self-pay | Admitting: Cardiology

## 2017-12-19 ENCOUNTER — Ambulatory Visit (INDEPENDENT_AMBULATORY_CARE_PROVIDER_SITE_OTHER): Payer: PPO | Admitting: Cardiology

## 2017-12-19 VITALS — BP 180/74 | HR 72 | Ht 69.0 in | Wt 178.0 lb

## 2017-12-19 DIAGNOSIS — I10 Essential (primary) hypertension: Secondary | ICD-10-CM | POA: Diagnosis not present

## 2017-12-19 DIAGNOSIS — I480 Paroxysmal atrial fibrillation: Secondary | ICD-10-CM | POA: Diagnosis not present

## 2017-12-19 DIAGNOSIS — F17210 Nicotine dependence, cigarettes, uncomplicated: Secondary | ICD-10-CM

## 2017-12-19 DIAGNOSIS — I2510 Atherosclerotic heart disease of native coronary artery without angina pectoris: Secondary | ICD-10-CM

## 2017-12-19 MED ORDER — HYDROCHLOROTHIAZIDE 25 MG PO TABS: 25.0000 mg | ORAL_TABLET | Freq: Every day | ORAL | 1 refills | Status: DC

## 2017-12-19 NOTE — Patient Instructions (Signed)
Medication Instructions:  Your physician has recommended you make the following change in your medication:  START hydrochlorothiazide 25 mg daily  Labwork: Get labwork done at PCP office and have them fax [place] the results  Testing/Procedures: None  Follow-Up: Your physician recommends that you schedule a follow-up appointment in: 7 months  Any Other Special Instructions Will Be Listed Below (If Applicable).   Pulse, blood pressure, and blood work at PCP in 2 weeks  If you need a refill on your cardiac medications before your next appointment, please call your pharmacy.   [place], RN, BSN

## 2017-12-19 NOTE — Progress Notes (Signed)
Cardiology Office Note:    Date:  12/19/2017   ID:  Randy Chase, DOB [birth year], MRN [id number]  PCP:  Jeanie Sewer, NP  Cardiologist:  Jenean Lindau, MD   Referring MD: Jeanie Sewer, NP    ASSESSMENT:    1. Paroxysmal atrial fibrillation (HCC)   2. Atherosclerosis of native coronary artery of native heart without angina pectoris   3. Essential hypertension   4. Cigarette smoker    PLAN:    In order of problems listed above:  1. Secondary prevention stressed with the patient.  Importance of compliance with diet and medications stressed and he vocalized understanding. 2. I discussed with the patient atrial fibrillation, disease process. Management and therapy including rate and rhythm control, anticoagulation benefits and potential risks were discussed extensively with the patient. Patient had multiple questions which were answered to patient's satisfaction. 3. His blood pressure is elevated and divided hydrochlorothiazide to his regimen.  He will have blood work today including fasting lipids.  He was advised to go and see his primary care physician in 2 weeks and have a pulse blood pressure check and a Chem-7.  Written instructions were given to him. 4. I spent 5 minutes with the patient discussing solely about smoking. Smoking cessation was counseled. I suggested to the patient also different medications and pharmacological interventions. Patient is keen to try stopping on its own at this time. He will get back to me if he needs any further assistance in this matter. 5. Patient will be seen in follow-up appointment in 6 months or earlier if the patient has any concerns    Medication Adjustments/Labs and Tests Ordered: Current medicines are reviewed at length with the patient today.  Concerns regarding medicines are outlined above.  No orders of the defined types were placed in this encounter.  No orders of the defined types were placed in this  encounter.    Chief Complaint  Patient presents with  . Follow-up     History of Present Illness:    Randy Chase is a 67 y.o. male.  Patient has history of essential hypertension and approximately fibrillation.  He denies any problems at this time and takes care of activities of daily living.  No chest pain orthopnea or PND.  He is walking some on a regular unfortunately continues to smoke.  Past Medical History:  Diagnosis Date  . Allergic rhinitis   . Anxiety   . Arthritis   . Bladder cancer ([place])   . BMI 25.0-25.9,adult   . Colitis   . Colonic polyp   . Hematuria   . Hernia, inguinal, right   . Low back pain   . Lumbar disc narrowing   . Tobacco use     Past Surgical History:  Procedure Laterality Date  . bladder cancer    . HERNIA REPAIR     Right inguinal  . SPLENIC ARTERY EMBOLIZATION     Splenic resection    Current Medications: Current Meds  Medication Sig  . albuterol (PROAIR HFA) 108 (90 Base) MCG/ACT inhaler Inhale 2 puffs into the lungs every 6 (six) hours as needed for wheezing or shortness of breath.  Marland Kitchen aspirin EC 325 MG tablet Take 1 tablet (325 mg total) by mouth daily.  Marland Kitchen atorvastatin (LIPITOR) 10 MG tablet Take 1 tablet (10 mg total) by mouth daily at 6 PM.  . busPIRone (BUSPAR) 5 MG tablet Take 10 mg by mouth 3 (three) times daily.  Marland Kitchen diltiazem (CARDIZEM) 60  MG tablet Take 60 mg by mouth 3 (three) times daily.  . fluticasone (FLONASE) 50 MCG/ACT nasal spray Place daily into both nostrils.  Marland Kitchen loratadine (CLARITIN) 10 MG tablet Take 10 mg by mouth daily.  . methadone (DOLOPHINE) 10 MG tablet Take 2 tablets in the morning and 3 tablets in the evening  . oxyCODONE (ROXICODONE) 15 MG immediate release tablet Take 1 tablet (15 mg total) by mouth every 6 (six) hours as needed.  . [DISCONTINUED] Fluoxetine HCl, PMDD, 10 MG TABS Take 1 tablet (10 mg total) by mouth daily.  . [DISCONTINUED] ibuprofen (ADVIL,MOTRIN) 200 MG tablet Take 200 mg by mouth 2  (two) times daily as needed for pain.  . [DISCONTINUED] umeclidinium-vilanterol (ANORO ELLIPTA) 62.5-25 MCG/INH AEPB Inhale 1 puff into the lungs daily.     Allergies:   Reglan [metoclopramide]   Social History   Socioeconomic History  . Marital status: Married    Spouse name: None  . Number of children: 1  . Years of education: 4  . Highest education level: None  Social Needs  . Financial resource strain: None  . Food insecurity - worry: None  . Food insecurity - inability: None  . Transportation needs - medical: None  . Transportation needs - non-medical: None  Occupational History  . None  Tobacco Use  . Smoking status: Current Every Day Smoker    Packs/day: 0.25    Types: Cigarettes  . Smokeless tobacco: Never Used  Substance and Sexual Activity  . Alcohol use: Yes    Alcohol/week: 16.8 oz    Types: 20 Cans of beer, 8 Standard drinks or equivalent per week    Comment: drinks 12 packs a week  . Drug use: No  . Sexual activity: None  Other Topics Concern  . None  Social History Narrative   Patient is right handed.    Patient does not drink caffeine.     Family History: The patient's family history includes Arthritis in his father and mother; Asthma in his father; Colon cancer in his mother; Stomach cancer in his paternal grandfather.  ROS:   Please see the history of present illness.    All other systems reviewed and are negative.  EKGs/Labs/Other Studies Reviewed:    The following studies were reviewed today: I reviewed echocardiogram and stress test with the patient at extensive length and questions were answered to her satisfaction.   Recent Labs: No results found for requested labs within last 8760 hours.  Recent Lipid Panel No results found for: CHOL, TRIG, HDL, CHOLHDL, VLDL, LDLCALC, LDLDIRECT  Physical Exam:    VS:  BP (!) 180/74 (BP Location: Right Arm, Patient Position: Sitting, Cuff Size: Normal)   Pulse 72   Ht 5\' 9"  (1.753 m)   Wt 178 lb  (80.7 kg)   SpO2 96%   BMI 26.29 kg/m     Wt Readings from Last 3 Encounters:  12/19/17 178 lb (80.7 kg)  11/11/17 178 lb 6.4 oz (80.9 kg)  10/06/17 173 lb 9.6 oz (78.7 kg)     GEN: Patient is in no acute distress HEENT: Normal NECK: No JVD; No carotid bruits LYMPHATICS: No lymphadenopathy CARDIAC: Hear sounds regular, 2/6 systolic murmur at the apex. RESPIRATORY:  Clear to auscultation without rales, wheezing or rhonchi  ABDOMEN: Soft, non-tender, non-distended MUSCULOSKELETAL:  No edema; No deformity  SKIN: Warm and dry NEUROLOGIC:  Alert and oriented x 3 PSYCHIATRIC:  Normal affect   Signed, Jenean Lindau, MD  12/19/2017 3:42  PM    [facility]

## 2018-01-09 DIAGNOSIS — Z6826 Body mass index (BMI) 26.0-26.9, adult: Secondary | ICD-10-CM | POA: Diagnosis not present

## 2018-01-09 DIAGNOSIS — I10 Essential (primary) hypertension: Secondary | ICD-10-CM | POA: Diagnosis not present

## 2018-01-13 ENCOUNTER — Telehealth: Payer: Self-pay | Admitting: Neurology

## 2018-01-13 DIAGNOSIS — G894 Chronic pain syndrome: Secondary | ICD-10-CM

## 2018-01-13 NOTE — Telephone Encounter (Signed)
The [facility] registry was checked.  The prescription is not due until 16 January 2018.

## 2018-01-13 NOTE — Telephone Encounter (Signed)
Patient requesting refill of methadone (DOLOPHINE) 10 MG tablet and oxyCODONE (ROXICODONE) 15 MG immediate release tablet.

## 2018-01-14 MED ORDER — METHADONE HCL 10 MG PO TABS: ORAL_TABLET | ORAL | 0 refills | Status: DC

## 2018-01-14 MED ORDER — OXYCODONE HCL 15 MG PO TABS: 15.0000 mg | ORAL_TABLET | Freq: Four times a day (QID) | ORAL | 0 refills | Status: DC | PRN

## 2018-01-14 NOTE — Addendum Note (Signed)
Addended by: Kathrynn Ducking on: 01/14/2018 10:19 AM   Modules accepted: Orders

## 2018-02-10 ENCOUNTER — Other Ambulatory Visit: Payer: Self-pay | Admitting: Neurology

## 2018-02-10 DIAGNOSIS — G894 Chronic pain syndrome: Secondary | ICD-10-CM

## 2018-02-10 NOTE — Telephone Encounter (Signed)
Pt request refill for methadone (DOLOPHINE) 10 MG tablet andoxyCODONE (ROXICODONE) 15 MG immediate release tablet sent to [facility]

## 2018-02-11 MED ORDER — METHADONE HCL 10 MG PO TABS: ORAL_TABLET | ORAL | 0 refills | Status: DC

## 2018-02-11 MED ORDER — OXYCODONE HCL 15 MG PO TABS: 15.0000 mg | ORAL_TABLET | Freq: Four times a day (QID) | ORAL | 0 refills | Status: DC | PRN

## 2018-02-19 ENCOUNTER — Telehealth: Payer: Self-pay

## 2018-02-19 NOTE — Telephone Encounter (Signed)
Left voicemail for the patient regarding overdue liver and lipid panel. Informed patient that they may have these completed at [facility] in [place].

## 2018-03-10 ENCOUNTER — Other Ambulatory Visit: Payer: Self-pay | Admitting: Neurology

## 2018-03-10 DIAGNOSIS — G894 Chronic pain syndrome: Secondary | ICD-10-CM

## 2018-03-10 NOTE — Telephone Encounter (Signed)
The methadone and oxycodone prescriptions are not due until 11 March 2018.  The [facility] registry was checked.

## 2018-03-10 NOTE — Telephone Encounter (Signed)
Pt calling for refill on methadone (DOLOPHINE) 10 MG tablet and oxyCODONE (ROXICODONE) 15 MG immediate release tablet  [address] [facility] - [place], [place] - [place]. [phone] (Phone) [phone] (Fax)

## 2018-03-11 ENCOUNTER — Other Ambulatory Visit: Payer: Self-pay | Admitting: Cardiology

## 2018-03-11 MED ORDER — OXYCODONE HCL 15 MG PO TABS: 15.0000 mg | ORAL_TABLET | Freq: Four times a day (QID) | ORAL | 0 refills | Status: DC | PRN

## 2018-03-11 MED ORDER — METHADONE HCL 10 MG PO TABS: ORAL_TABLET | ORAL | 0 refills | Status: DC

## 2018-03-11 NOTE — Telephone Encounter (Signed)
The prescriptions will be sent in today.

## 2018-03-11 NOTE — Addendum Note (Signed)
Addended by: Kathrynn Ducking on: 03/11/2018 07:14 AM   Modules accepted: Orders

## 2018-04-07 ENCOUNTER — Telehealth: Payer: Self-pay | Admitting: Neurology

## 2018-04-07 DIAGNOSIS — G894 Chronic pain syndrome: Secondary | ICD-10-CM

## 2018-04-07 NOTE — Telephone Encounter (Signed)
The methadone and oxycodone prescriptions are not due until 08 Apr 2018, the [facility] registry was checked.

## 2018-04-07 NOTE — Telephone Encounter (Signed)
Patient requesting refill of methadone (DOLOPHINE) 10 MG tablet and oxyCODONE (ROXICODONE) 15 MG immediate release tablet sent to [facility] in [place].

## 2018-04-08 MED ORDER — OXYCODONE HCL 15 MG PO TABS: 15.0000 mg | ORAL_TABLET | Freq: Four times a day (QID) | ORAL | 0 refills | Status: DC | PRN

## 2018-04-08 MED ORDER — METHADONE HCL 10 MG PO TABS
ORAL_TABLET | ORAL | 0 refills | Status: DC
Start: 2018-04-08 — End: 2018-05-05

## 2018-04-08 NOTE — Addendum Note (Signed)
Addended by: Kathrynn Ducking on: 04/08/2018 07:16 AM   Modules accepted: Orders

## 2018-04-15 ENCOUNTER — Encounter: Payer: Self-pay | Admitting: Neurology

## 2018-04-15 ENCOUNTER — Ambulatory Visit (INDEPENDENT_AMBULATORY_CARE_PROVIDER_SITE_OTHER): Payer: PPO | Admitting: Neurology

## 2018-04-15 ENCOUNTER — Other Ambulatory Visit: Payer: Self-pay

## 2018-04-15 VITALS — BP 169/91 | HR 70 | Ht 69.0 in | Wt 180.0 lb

## 2018-04-15 DIAGNOSIS — G894 Chronic pain syndrome: Secondary | ICD-10-CM | POA: Diagnosis not present

## 2018-04-15 NOTE — Progress Notes (Signed)
Reason for visit: Chronic pain  Randy Chase is an 67 y.o. male  History of present illness:  Randy Chase is a 67 year old right-handed white male with a history of chronic low back pain.  The patient is on chronic opioid therapy.  He comes back in today indicating that he had an episode of a pulmonary embolism associated with shortness of breath that occurred in October 2018.  The patient was noted to have some issues with atrial fibrillation around that time.  He currently is not on anticoagulation therapy, but he may have been on anticoagulants for several months, it was not felt that he required ongoing management in this regard, he is currently on aspirin.  The patient does have episodes of low back pain.  He suffers from anxiety, he is on BuSpar for this.  He returns to the office for an evaluation.  Past Medical History:  Diagnosis Date  . Allergic rhinitis   . Anxiety   . Arthritis   . Bladder cancer ([place])   . BMI 25.0-25.9,adult   . Colitis   . Colonic polyp   . Hematuria   . Hernia, inguinal, right   . Low back pain   . Lumbar disc narrowing   . Tobacco use     Past Surgical History:  Procedure Laterality Date  . bladder cancer    . HERNIA REPAIR     Right inguinal  . SPLENIC ARTERY EMBOLIZATION     Splenic resection    Family History  Problem Relation Age of Onset  . Colon cancer Mother   . Arthritis Mother   . Asthma Father   . Arthritis Father   . Stomach cancer Paternal Grandfather     Social history:  reports that he has been smoking cigarettes.  He has been smoking about 0.25 packs per day. He has never used smokeless tobacco. He reports that he drinks about 16.8 oz of alcohol per week. He reports that he does not use drugs.    Allergies  Allergen Reactions  . Reglan [Metoclopramide]     Medications:  Prior to Admission medications   Medication Sig Start Date End Date Taking? Authorizing Provider  albuterol (PROAIR HFA) 108 (90 Base) MCG/ACT  inhaler Inhale 2 puffs into the lungs every 6 (six) hours as needed for wheezing or shortness of breath.   Yes [provider]  aspirin EC 325 MG tablet Take 1 tablet (325 mg total) by mouth daily. 11/11/17  Yes Revankar, Reita Cliche, MD  atorvastatin (LIPITOR) 10 MG tablet TAKE ONE TABLET BY MOUTH DAILY AT 6pm 03/11/18  Yes Revankar, Reita Cliche, MD  busPIRone (BUSPAR) 5 MG tablet Take 10 mg by mouth 3 (three) times daily.   Yes [provider]  diltiazem (CARDIZEM) 60 MG tablet Take 60 mg by mouth 3 (three) times daily.   Yes [provider]  fluticasone (FLONASE) 50 MCG/ACT nasal spray Place daily into both nostrils.   Yes [provider]  loratadine (CLARITIN) 10 MG tablet Take 10 mg by mouth daily.   Yes [provider]  methadone (DOLOPHINE) 10 MG tablet Take 2 tablets in the morning and 3 tablets in the evening 04/08/18  Yes Kathrynn Ducking, MD  oxyCODONE (ROXICODONE) 15 MG immediate release tablet Take 1 tablet (15 mg total) by mouth every 6 (six) hours as needed. 04/08/18  Yes Kathrynn Ducking, MD  hydrochlorothiazide (HYDRODIURIL) 25 MG tablet Take 1 tablet (25 mg total) by mouth daily.  Patient taking differently: Take 25 mg by mouth as needed.  12/19/17 03/19/18  Revankar, Reita Cliche, MD    ROS:  Out of a complete 14 system review of symptoms, the patient complains only of the following symptoms, and all other reviewed systems are negative.  Ringing in the ears Decreased activity Eye itching Shortness of breath Joint pain, joint swelling, back pain, walking difficulty Bruising easily Memory loss, numbness Depression, anxiety  Blood pressure (!) 169/91, pulse 70, height 5\' 9"  (1.753 m), weight 180 lb (81.6 kg).  Physical Exam  General: The patient is alert and cooperative at the time of the examination.  Neuromuscular: Range movement of the low back is full.  Skin: No significant peripheral edema is noted.   Neurologic Exam  Mental  status: The patient is alert and oriented x 3 at the time of the examination. The patient has apparent normal recent and remote memory, with an apparently normal attention span and concentration ability.   Cranial nerves: Facial symmetry is present. Speech is normal, no aphasia or dysarthria is noted. Extraocular movements are full. Visual fields are full.  Motor: The patient has good strength in all 4 extremities.  Sensory examination: Soft touch sensation is symmetric on the face, arms, and legs.  Coordination: The patient has good finger-nose-finger and heel-to-shin bilaterally.  Gait and station: The patient has a normal gait. Tandem gait is normal. Romberg is negative. No drift is seen.  Reflexes: Deep tendon reflexes are symmetric.   Assessment/Plan:  1.  Chronic low back pain  The patient will continue his opiate therapy for now, I indicated that on the next visit we will need to look for a pain center.  He will have a urine drug screen done today.  He will follow-up in 6 months.  Jill Alexanders MD 04/15/2018 3:32 PM  [facility] [address] [place], [place] [address]  Phone [phone] Fax [phone]

## 2018-04-24 LAB — COMPLIANCE DRUG ANALYSIS, UR

## 2018-05-05 ENCOUNTER — Telehealth: Payer: Self-pay | Admitting: Neurology

## 2018-05-05 DIAGNOSIS — G894 Chronic pain syndrome: Secondary | ICD-10-CM

## 2018-05-05 NOTE — Telephone Encounter (Signed)
Pt request refills for oxyCODONE (ROXICODONE) 15 MG immediate release tablet and methadone (DOLOPHINE) 10 MG tablet sent to [facility]

## 2018-05-06 MED ORDER — OXYCODONE HCL 15 MG PO TABS: 15.0000 mg | ORAL_TABLET | Freq: Four times a day (QID) | ORAL | 0 refills | Status: DC | PRN

## 2018-05-06 MED ORDER — METHADONE HCL 10 MG PO TABS
ORAL_TABLET | ORAL | 0 refills | Status: DC
Start: 2018-05-06 — End: 2018-06-05

## 2018-05-06 NOTE — Telephone Encounter (Signed)
The medication refills for oxycodone and methadone were already sent to the pharmacy this morning.  The reason why he has to wait until today is because it cannot be refilled prior to 28 days between prescriptions, this has always been this way.  Not sure why he has run out of his medications because he has been given 30 days worth of medication.  The [facility] registry was checked prior to authorization for the refills.

## 2018-05-06 NOTE — Telephone Encounter (Signed)
Pt called stating that he is currently out of both medications and isnt sure why he has to wait till the 15th to refill medications. Please call to advise.

## 2018-05-21 DIAGNOSIS — F411 Generalized anxiety disorder: Secondary | ICD-10-CM | POA: Diagnosis not present

## 2018-05-21 DIAGNOSIS — J984 Other disorders of lung: Secondary | ICD-10-CM | POA: Diagnosis not present

## 2018-05-21 DIAGNOSIS — N401 Enlarged prostate with lower urinary tract symptoms: Secondary | ICD-10-CM | POA: Diagnosis not present

## 2018-05-21 DIAGNOSIS — R600 Localized edema: Secondary | ICD-10-CM | POA: Diagnosis not present

## 2018-05-21 DIAGNOSIS — Z6828 Body mass index (BMI) 28.0-28.9, adult: Secondary | ICD-10-CM | POA: Diagnosis not present

## 2018-05-21 DIAGNOSIS — I4891 Unspecified atrial fibrillation: Secondary | ICD-10-CM | POA: Diagnosis not present

## 2018-05-28 DIAGNOSIS — R079 Chest pain, unspecified: Secondary | ICD-10-CM | POA: Diagnosis not present

## 2018-05-28 DIAGNOSIS — R600 Localized edema: Secondary | ICD-10-CM | POA: Diagnosis not present

## 2018-05-28 DIAGNOSIS — Z79899 Other long term (current) drug therapy: Secondary | ICD-10-CM | POA: Diagnosis not present

## 2018-05-28 DIAGNOSIS — J439 Emphysema, unspecified: Secondary | ICD-10-CM | POA: Diagnosis not present

## 2018-05-28 DIAGNOSIS — Z79891 Long term (current) use of opiate analgesic: Secondary | ICD-10-CM | POA: Diagnosis not present

## 2018-05-28 DIAGNOSIS — R609 Edema, unspecified: Secondary | ICD-10-CM | POA: Diagnosis not present

## 2018-05-28 DIAGNOSIS — R0602 Shortness of breath: Secondary | ICD-10-CM | POA: Diagnosis not present

## 2018-05-28 DIAGNOSIS — F419 Anxiety disorder, unspecified: Secondary | ICD-10-CM | POA: Diagnosis not present

## 2018-05-28 DIAGNOSIS — F17210 Nicotine dependence, cigarettes, uncomplicated: Secondary | ICD-10-CM | POA: Diagnosis not present

## 2018-06-03 DIAGNOSIS — I2510 Atherosclerotic heart disease of native coronary artery without angina pectoris: Secondary | ICD-10-CM | POA: Diagnosis not present

## 2018-06-03 DIAGNOSIS — R600 Localized edema: Secondary | ICD-10-CM | POA: Diagnosis not present

## 2018-06-03 DIAGNOSIS — J984 Other disorders of lung: Secondary | ICD-10-CM | POA: Diagnosis not present

## 2018-06-03 DIAGNOSIS — G4739 Other sleep apnea: Secondary | ICD-10-CM | POA: Diagnosis not present

## 2018-06-03 DIAGNOSIS — Z6827 Body mass index (BMI) 27.0-27.9, adult: Secondary | ICD-10-CM | POA: Diagnosis not present

## 2018-06-03 DIAGNOSIS — F411 Generalized anxiety disorder: Secondary | ICD-10-CM | POA: Diagnosis not present

## 2018-06-05 ENCOUNTER — Other Ambulatory Visit: Payer: Self-pay | Admitting: Neurology

## 2018-06-05 DIAGNOSIS — G894 Chronic pain syndrome: Secondary | ICD-10-CM

## 2018-06-05 MED ORDER — OXYCODONE HCL 15 MG PO TABS: 15.0000 mg | ORAL_TABLET | Freq: Four times a day (QID) | ORAL | 0 refills | Status: DC | PRN

## 2018-06-05 MED ORDER — METHADONE HCL 10 MG PO TABS: ORAL_TABLET | ORAL | 0 refills | Status: DC

## 2018-06-05 NOTE — Telephone Encounter (Signed)
Pt requesting a refill for oxyCODONE (ROXICODONE) 15 MG immediate release tablet and methadone (DOLOPHINE) 10 MG tablet sent to [facility]

## 2018-07-07 ENCOUNTER — Other Ambulatory Visit: Payer: Self-pay | Admitting: Neurology

## 2018-07-07 DIAGNOSIS — G894 Chronic pain syndrome: Secondary | ICD-10-CM

## 2018-07-07 MED ORDER — OXYCODONE HCL 15 MG PO TABS: 15.0000 mg | ORAL_TABLET | Freq: Four times a day (QID) | ORAL | 0 refills | Status: DC | PRN

## 2018-07-07 MED ORDER — METHADONE HCL 10 MG PO TABS: ORAL_TABLET | ORAL | 0 refills | Status: DC

## 2018-07-07 NOTE — Telephone Encounter (Signed)
Pts requesting refills for methadone (DOLOPHINE) 10 MG tablet and oxyCODONE (ROXICODONE) 15 MG immediate release tablet sent to [facility]

## 2018-07-07 NOTE — Telephone Encounter (Signed)
Pt requesting a call, stating he will be out of medications tomorrow. Please call to advise

## 2018-07-07 NOTE — Telephone Encounter (Signed)
Called the pt and made him aware the medication refill had been sent. Pt states that he runs out of the medication and will need to pick it up. Informed him I will resend to Dr Jannifer Franklin and have him reassess.

## 2018-07-07 NOTE — Telephone Encounter (Signed)
I have routed this request to Dr. Jannifer Franklin for review. The pt is due for the medication and [place] registry was verified.

## 2018-07-08 MED ORDER — METHADONE HCL 10 MG PO TABS: ORAL_TABLET | ORAL | 0 refills | Status: DC

## 2018-07-08 MED ORDER — OXYCODONE HCL 15 MG PO TABS
15.0000 mg | ORAL_TABLET | Freq: Four times a day (QID) | ORAL | 0 refills | Status: DC | PRN
Start: 2018-07-08 — End: 2018-08-05

## 2018-07-08 NOTE — Telephone Encounter (Signed)
Pt has called back. He is very anxious about getting the script sent. Pt has apologized several times for calling so often.

## 2018-07-08 NOTE — Telephone Encounter (Signed)
I called pharmacy and spoke with Cyril Mourning and made her aware that it was ok to fill medications today.   I spoke with Randy Chase and made him aware that this has been taken care of. Randy Chase voiced appreciation.

## 2018-07-08 NOTE — Addendum Note (Signed)
Addended by: Kathrynn Ducking on: 07/08/2018 01:06 PM   Modules accepted: Orders

## 2018-07-08 NOTE — Telephone Encounter (Signed)
Pt has called again. He is anxious to get the medication. He is aware the message has been sent to Dr Viona Gilmore and awaiting his approval. He is asking to be called when it has been sent to pharmacy

## 2018-07-08 NOTE — Telephone Encounter (Signed)
Pt called stating he is now out of medication now, pt requesting fill date changed to today. Pt would like refills this morning if possible

## 2018-07-08 NOTE — Telephone Encounter (Signed)
The prescription was sent yesterday, I will resend the prescription today.

## 2018-07-28 DIAGNOSIS — Z23 Encounter for immunization: Secondary | ICD-10-CM | POA: Diagnosis not present

## 2018-07-28 DIAGNOSIS — Z9181 History of falling: Secondary | ICD-10-CM | POA: Diagnosis not present

## 2018-07-28 DIAGNOSIS — Z125 Encounter for screening for malignant neoplasm of prostate: Secondary | ICD-10-CM | POA: Diagnosis not present

## 2018-07-28 DIAGNOSIS — Z1339 Encounter for screening examination for other mental health and behavioral disorders: Secondary | ICD-10-CM | POA: Diagnosis not present

## 2018-07-28 DIAGNOSIS — Z0000 Encounter for general adult medical examination without abnormal findings: Secondary | ICD-10-CM | POA: Diagnosis not present

## 2018-07-28 DIAGNOSIS — Z139 Encounter for screening, unspecified: Secondary | ICD-10-CM | POA: Diagnosis not present

## 2018-07-28 DIAGNOSIS — E785 Hyperlipidemia, unspecified: Secondary | ICD-10-CM | POA: Diagnosis not present

## 2018-08-05 ENCOUNTER — Other Ambulatory Visit: Payer: Self-pay | Admitting: Neurology

## 2018-08-05 DIAGNOSIS — G894 Chronic pain syndrome: Secondary | ICD-10-CM

## 2018-08-05 MED ORDER — OXYCODONE HCL 15 MG PO TABS: 15.0000 mg | ORAL_TABLET | Freq: Four times a day (QID) | ORAL | 0 refills | Status: DC | PRN

## 2018-08-05 MED ORDER — METHADONE HCL 10 MG PO TABS: ORAL_TABLET | ORAL | 0 refills | Status: DC

## 2018-08-05 NOTE — Addendum Note (Signed)
Addended by: Rossie Muskrat L on: 08/05/2018 10:06 AM   Modules accepted: Orders

## 2018-08-05 NOTE — Telephone Encounter (Signed)
Pt is asking for a refill on his methadone (DOLOPHINE) 10 MG tablet and oxyCODONE (ROXICODONE) 15 MG immediate release tablet please send to  [place], [place] - [place]. [phone] (Phone) [phone] (Fax)

## 2018-09-02 ENCOUNTER — Telehealth: Payer: Self-pay | Admitting: Neurology

## 2018-09-02 DIAGNOSIS — G894 Chronic pain syndrome: Secondary | ICD-10-CM

## 2018-09-02 MED ORDER — OXYCODONE HCL 15 MG PO TABS: 15.0000 mg | ORAL_TABLET | Freq: Four times a day (QID) | ORAL | 0 refills | Status: DC | PRN

## 2018-09-02 MED ORDER — METHADONE HCL 10 MG PO TABS: ORAL_TABLET | ORAL | 0 refills | Status: DC

## 2018-09-02 NOTE — Telephone Encounter (Signed)
The prescription for oxycodone and methadone were refilled.

## 2018-09-02 NOTE — Telephone Encounter (Signed)
Pt request refills for oxyCODONE (ROXICODONE) 15 MG immediate release tablet and methadone (DOLOPHINE) 10 MG tablet sent to [place], [place].

## 2018-09-29 ENCOUNTER — Telehealth: Payer: Self-pay | Admitting: Neurology

## 2018-09-29 DIAGNOSIS — G894 Chronic pain syndrome: Secondary | ICD-10-CM

## 2018-09-29 NOTE — Telephone Encounter (Signed)
The [facility] registry was checked, the medication will be due on 30 September 2018.

## 2018-09-29 NOTE — Telephone Encounter (Signed)
Pt is calling for a refill on his oxyCODONE (ROXICODONE) 15 MG immediate release tablet and methadone (DOLOPHINE) 10 MG tablet [address] [facility] - [place], [place] - [place]. [phone] (Phone) [phone] (Fax)

## 2018-09-30 MED ORDER — METHADONE HCL 10 MG PO TABS: ORAL_TABLET | ORAL | 0 refills | Status: DC

## 2018-09-30 MED ORDER — NALOXONE HCL 4 MG/0.1ML NA LIQD: NASAL | 0 refills | Status: AC

## 2018-09-30 MED ORDER — OXYCODONE HCL 15 MG PO TABS
15.0000 mg | ORAL_TABLET | Freq: Four times a day (QID) | ORAL | 0 refills | Status: DC | PRN
Start: 2018-09-30 — End: 2018-10-28

## 2018-09-30 NOTE — Telephone Encounter (Signed)
The prescription for oxycodone and methadone were sent in.

## 2018-09-30 NOTE — Telephone Encounter (Signed)
Randy Chase with [facility] in [place] calling regarding Rx's sent for oxycodone and methadone. Patient's overdose score is high. Please call and discuss.

## 2018-09-30 NOTE — Telephone Encounter (Signed)
Spoke with Lonn Georgia at [facility]. She sts. that due to pt's chronic opioid use for back pain, the pharmacist is requesting a Narcan rx. for pt. I explained pt. has been stable on current meds and doses for quite some time, and has shown no signs of overuse. Pharamcy still requesting Narcan rx. in order to fill opioids as well.  [place] for per [place]. Rx. for Narcan nasal spray escribed to [facility]. I called pt. to let him know Narcan rx. was sent in at the request of the pharamcy, and instructed him on appropriate use of Narcan nasal spray; requested he let family members know this should be used in the event he was ever unresponsive and accidental overdose was suspected.  He verbalized understanding of same/fim

## 2018-09-30 NOTE — Addendum Note (Signed)
Addended by: Kathrynn Ducking on: 09/30/2018 08:28 AM   Modules accepted: Orders

## 2018-09-30 NOTE — Addendum Note (Signed)
Addended by: France Ravens I on: 09/30/2018 10:34 AM   Modules accepted: Orders

## 2018-10-19 DIAGNOSIS — J438 Other emphysema: Secondary | ICD-10-CM | POA: Insufficient documentation

## 2018-10-19 DIAGNOSIS — F419 Anxiety disorder, unspecified: Secondary | ICD-10-CM | POA: Diagnosis not present

## 2018-10-19 DIAGNOSIS — I10 Essential (primary) hypertension: Secondary | ICD-10-CM | POA: Diagnosis not present

## 2018-10-19 DIAGNOSIS — K432 Incisional hernia without obstruction or gangrene: Secondary | ICD-10-CM | POA: Insufficient documentation

## 2018-10-19 DIAGNOSIS — R399 Unspecified symptoms and signs involving the genitourinary system: Secondary | ICD-10-CM | POA: Insufficient documentation

## 2018-10-19 HISTORY — DX: Incisional hernia without obstruction or gangrene: K43.2

## 2018-10-19 HISTORY — DX: Unspecified symptoms and signs involving the genitourinary system: R39.9

## 2018-10-19 HISTORY — DX: Other emphysema: J43.8

## 2018-10-26 ENCOUNTER — Other Ambulatory Visit: Payer: Self-pay

## 2018-10-26 ENCOUNTER — Encounter: Payer: Self-pay | Admitting: Neurology

## 2018-10-26 ENCOUNTER — Ambulatory Visit (INDEPENDENT_AMBULATORY_CARE_PROVIDER_SITE_OTHER): Payer: PPO | Admitting: Neurology

## 2018-10-26 VITALS — BP 138/66 | HR 78 | Resp 16 | Ht 69.0 in | Wt 175.0 lb

## 2018-10-26 DIAGNOSIS — M545 Low back pain: Secondary | ICD-10-CM

## 2018-10-26 DIAGNOSIS — G8929 Other chronic pain: Secondary | ICD-10-CM | POA: Diagnosis not present

## 2018-10-26 NOTE — Progress Notes (Signed)
Reason for visit: Chronic low back pain  Randy Chase is an 67 y.o. male  History of present illness:  Randy Chase is a 67 year old right-handed white male with a history of chronic low back pain.  The patient has had ongoing problems with shortness of breath, he is being evaluated for this.  The patient is on Cardizem for atrial fibrillation.  He has had 2 falls recently associated with being dizzy, he would bend over to pick up something and then stand up and get dizzy.  The patient otherwise has not had any falls.  He remains on methadone and takes oxycodone for breakthrough pain.  He seems to be stable with the level of pain.  He returns for an evaluation.  Past Medical History:  Diagnosis Date  . Allergic rhinitis   . Anxiety   . Arthritis   . Bladder cancer ([place])   . BMI 25.0-25.9,adult   . Colitis   . Colonic polyp   . Hematuria   . Hernia, inguinal, right   . Low back pain   . Lumbar disc narrowing   . Tobacco use     Past Surgical History:  Procedure Laterality Date  . bladder cancer    . HERNIA REPAIR     Right inguinal  . SPLENIC ARTERY EMBOLIZATION     Splenic resection    Family History  Problem Relation Age of Onset  . Colon cancer Mother   . Arthritis Mother   . Asthma Father   . Arthritis Father   . Stomach cancer Paternal Grandfather     Social history:  reports that he has been smoking cigarettes. He has been smoking about 0.25 packs per day. He has never used smokeless tobacco. He reports that he drinks about 28.0 standard drinks of alcohol per week. He reports that he does not use drugs.    Allergies  Allergen Reactions  . Reglan [Metoclopramide]     Medications:  Prior to Admission medications   Medication Sig Start Date End Date Taking? Authorizing Provider  albuterol (PROAIR HFA) 108 (90 Base) MCG/ACT inhaler Inhale 2 puffs into the lungs every 6 (six) hours as needed for wheezing or shortness of breath.   Yes [provider]    diltiazem (CARDIZEM) 60 MG tablet Take 60 mg by mouth 3 (three) times daily.   Yes [provider]  escitalopram (LEXAPRO) 10 MG tablet Take 10 mg by mouth daily.   Yes [provider]  fluticasone (FLONASE) 50 MCG/ACT nasal spray Place daily into both nostrils.   Yes [provider]  loratadine (CLARITIN) 10 MG tablet Take 10 mg by mouth daily.   Yes [provider]  methadone (DOLOPHINE) 10 MG tablet Take 2 tablets in the morning and 3 tablets in the evening 09/30/18  Yes Kathrynn Ducking, MD  naloxone [facility]) nasal spray 4 mg/0.1 mL Use as directed 09/30/18  Yes Kathrynn Ducking, MD  oxyCODONE (ROXICODONE) 15 MG immediate release tablet Take 1 tablet (15 mg total) by mouth every 6 (six) hours as needed. Must last 28 days 09/30/18  Yes Kathrynn Ducking, MD  aspirin EC 325 MG tablet Take 1 tablet (325 mg total) by mouth daily. Patient not taking: Reported on 10/26/2018 11/11/17   Revankar, Reita Cliche, MD    ROS:  Out of a complete 14 system review of symptoms, the patient complains only of the following symptoms, and all other reviewed systems are negative.  Low back pain  Dizziness  Blood pressure 138/66, pulse 78, resp. rate 16, height 5\' 9"  (1.753 m), weight 175 lb (79.4 kg).  Physical Exam  General: The patient is alert and cooperative at the time of the examination.  Skin: No significant peripheral edema is noted.   Neurologic Exam  Mental status: The patient is alert and oriented x 3 at the time of the examination. The patient has apparent normal recent and remote memory, with an apparently normal attention span and concentration ability.   Cranial nerves: Facial symmetry is present. Speech is normal, no aphasia or dysarthria is noted. Extraocular movements are full. Visual fields are full.  Motor: The patient has good strength in all 4 extremities.  Sensory examination: Soft touch sensation is symmetric on the face, arms, and  legs.  Coordination: The patient has good finger-nose-finger and heel-to-shin bilaterally.  Gait and station: The patient has a normal gait. Tandem gait is normal. Romberg is negative. No drift is seen.  Reflexes: Deep tendon reflexes are symmetric.   Assessment/Plan:  1.  Chronic low back pain  The patient will continue his current medications.  On the next visit, we will make a referral to a pain center.  His medications will be due on 28 October 2018.  Jill Alexanders MD 10/26/2018 2:50 PM  [facility] [address] [place], [place] [address]  Phone [phone] Fax [phone]

## 2018-10-27 ENCOUNTER — Telehealth: Payer: Self-pay | Admitting: Neurology

## 2018-10-27 DIAGNOSIS — G894 Chronic pain syndrome: Secondary | ICD-10-CM

## 2018-10-27 NOTE — Telephone Encounter (Signed)
Pt request refills for oxyCODONE (ROXICODONE) 15 MG immediate release tablet and methadone (DOLOPHINE) 10 MG tablet sent to [facility]

## 2018-10-28 MED ORDER — METHADONE HCL 10 MG PO TABS: ORAL_TABLET | ORAL | 0 refills | Status: DC

## 2018-10-28 MED ORDER — OXYCODONE HCL 15 MG PO TABS: 15.0000 mg | ORAL_TABLET | Freq: Four times a day (QID) | ORAL | 0 refills | Status: DC | PRN

## 2018-10-28 NOTE — Telephone Encounter (Signed)
Oxycodone and Methadone Rx were sent in today.

## 2018-11-09 DIAGNOSIS — I2510 Atherosclerotic heart disease of native coronary artery without angina pectoris: Secondary | ICD-10-CM | POA: Diagnosis not present

## 2018-11-09 DIAGNOSIS — J438 Other emphysema: Secondary | ICD-10-CM | POA: Diagnosis not present

## 2018-11-09 DIAGNOSIS — J018 Other acute sinusitis: Secondary | ICD-10-CM | POA: Diagnosis not present

## 2018-11-09 DIAGNOSIS — F419 Anxiety disorder, unspecified: Secondary | ICD-10-CM | POA: Diagnosis not present

## 2018-11-09 DIAGNOSIS — I480 Paroxysmal atrial fibrillation: Secondary | ICD-10-CM | POA: Diagnosis not present

## 2018-11-09 DIAGNOSIS — R609 Edema, unspecified: Secondary | ICD-10-CM | POA: Diagnosis not present

## 2018-11-09 DIAGNOSIS — I10 Essential (primary) hypertension: Secondary | ICD-10-CM | POA: Diagnosis not present

## 2018-11-23 ENCOUNTER — Other Ambulatory Visit: Payer: Self-pay | Admitting: Neurology

## 2018-11-23 DIAGNOSIS — G894 Chronic pain syndrome: Secondary | ICD-10-CM

## 2018-11-23 MED ORDER — OXYCODONE HCL 15 MG PO TABS: 15.0000 mg | ORAL_TABLET | Freq: Four times a day (QID) | ORAL | 0 refills | Status: DC | PRN

## 2018-11-23 MED ORDER — METHADONE HCL 10 MG PO TABS: ORAL_TABLET | ORAL | 0 refills | Status: DC

## 2018-11-23 NOTE — Telephone Encounter (Signed)
Refill request for Oxycodone 15 mg immediate release tablet.  Drug registry verified, last refill was 10/28/2018 # 120 for a 30 day supply, provided by our office.  Last office visit was 10/26/2018, f/u scheduled for 05/20/19.

## 2018-11-23 NOTE — Telephone Encounter (Signed)
Pt has called for a refill on his oxyCODONE (ROXICODONE) 15 MG immediate release tablet and methadone (DOLOPHINE) 10 MG tablet [facility]

## 2018-11-23 NOTE — Telephone Encounter (Signed)
Prescriptions for the oxycodone and methadone were sent in.

## 2018-11-23 NOTE — Addendum Note (Signed)
Addended by: Kathrynn Ducking on: 11/23/2018 05:24 PM   Modules accepted: Orders

## 2018-11-24 ENCOUNTER — Other Ambulatory Visit: Payer: Self-pay | Admitting: Neurology

## 2018-11-24 DIAGNOSIS — G894 Chronic pain syndrome: Secondary | ICD-10-CM

## 2018-11-24 MED ORDER — METHADONE HCL 10 MG PO TABS: ORAL_TABLET | ORAL | 0 refills | Status: DC

## 2018-12-04 DIAGNOSIS — J438 Other emphysema: Secondary | ICD-10-CM | POA: Diagnosis not present

## 2018-12-04 DIAGNOSIS — R05 Cough: Secondary | ICD-10-CM | POA: Diagnosis not present

## 2018-12-10 DIAGNOSIS — I10 Essential (primary) hypertension: Secondary | ICD-10-CM | POA: Diagnosis not present

## 2018-12-10 DIAGNOSIS — J438 Other emphysema: Secondary | ICD-10-CM | POA: Diagnosis not present

## 2018-12-21 ENCOUNTER — Other Ambulatory Visit: Payer: Self-pay | Admitting: Neurology

## 2018-12-21 NOTE — Telephone Encounter (Signed)
The prescriptions for oxycodone and methadone are not due until 22 December 2018.

## 2018-12-21 NOTE — Telephone Encounter (Signed)
Pt has called for a refill on his oxyCODONE (ROXICODONE) 15 MG immediate release tablet and  methadone (DOLOPHINE) 10 MG tablet [facility] - *no changes to insurance*

## 2018-12-21 NOTE — Telephone Encounter (Signed)
Pt requesting a refill of Oxycodone 15 mg/ Methadone 10 mg. [place] drug registry verified. Oxycodone refill was last written on 11/24/18 # 120 for a 28 day supply provided by our office. Methadone 10 mg last rx written on # 150 for a 30 day supply provided by our office/ Last o/v 10/26/18 next f/u scheduled for 05/20/19.

## 2018-12-22 ENCOUNTER — Telehealth: Payer: Self-pay | Admitting: Neurology

## 2018-12-22 DIAGNOSIS — G894 Chronic pain syndrome: Secondary | ICD-10-CM

## 2018-12-22 MED ORDER — OXYCODONE HCL 15 MG PO TABS: 15.0000 mg | ORAL_TABLET | Freq: Four times a day (QID) | ORAL | 0 refills | Status: DC | PRN

## 2018-12-22 MED ORDER — METHADONE HCL 10 MG PO TABS: ORAL_TABLET | ORAL | 0 refills | Status: DC

## 2018-12-22 NOTE — Addendum Note (Signed)
Addended by: Kathrynn Ducking on: 12/22/2018 05:04 PM   Modules accepted: Orders

## 2018-12-22 NOTE — Telephone Encounter (Signed)
The prescriptions for oxycodone and methadone will be refilled.

## 2018-12-22 NOTE — Telephone Encounter (Addendum)
[place] drug registry verified. Oxycodone refill was last written on 11/24/18 # 120 for a 28 day supply provided by our office. Methadone 10 mg last rx written on # 150 for a 30 day supply provided by our office/ Last o/v 10/26/18 next f/u scheduled for 05/20/19.

## 2018-12-22 NOTE — Telephone Encounter (Signed)
Patient calling to check status of refills of methadone (DOLOPHINE) 10 MG tablet and Oxycodone.methadone (DOLOPHINE) 10 MG tablet. He states that he called yesterday for refills & also called this morning & was told it was on Dr Jannifer Franklin' desk. His pharmacy is [facility] [address] in [place] Please call him he will be out of his medication in the morning.

## 2019-01-15 DIAGNOSIS — J438 Other emphysema: Secondary | ICD-10-CM | POA: Diagnosis not present

## 2019-01-15 DIAGNOSIS — I10 Essential (primary) hypertension: Secondary | ICD-10-CM | POA: Diagnosis not present

## 2019-01-15 DIAGNOSIS — J018 Other acute sinusitis: Secondary | ICD-10-CM | POA: Diagnosis not present

## 2019-01-18 ENCOUNTER — Other Ambulatory Visit: Payer: Self-pay | Admitting: Neurology

## 2019-01-18 DIAGNOSIS — G894 Chronic pain syndrome: Secondary | ICD-10-CM

## 2019-01-18 NOTE — Telephone Encounter (Signed)
Patient needs refills of methadone and oxycodone. Best call back is [phone]

## 2019-01-18 NOTE — Telephone Encounter (Signed)
The prescriptions will be due on 19 December 2018.

## 2019-01-18 NOTE — Addendum Note (Signed)
Addended by: Verlin Grills T on: 01/18/2019 03:43 PM   Modules accepted: Orders

## 2019-01-18 NOTE — Telephone Encounter (Signed)
Patient is requesting refills for Oxycodone 15 mg and Methadone 10 mg. Last refill for Oxycodone 15 mg was filled on 12/22/18 # 120 for a 30 day supply provided by Dr. Jannifer Franklin.  Last refill for Methadone 10 mg was written on 12/22/18 # 150 for a  30 day supply provided by Dr. Jannifer Franklin. Patient has been compliant with f/u's.

## 2019-01-18 NOTE — Telephone Encounter (Signed)
Patient wants his medications to go to [facility]

## 2019-01-19 ENCOUNTER — Other Ambulatory Visit: Payer: Self-pay | Admitting: Neurology

## 2019-01-19 DIAGNOSIS — G894 Chronic pain syndrome: Secondary | ICD-10-CM

## 2019-01-19 MED ORDER — METHADONE HCL 10 MG PO TABS: ORAL_TABLET | ORAL | 0 refills | Status: DC

## 2019-01-19 MED ORDER — OXYCODONE HCL 15 MG PO TABS: 15.0000 mg | ORAL_TABLET | Freq: Four times a day (QID) | ORAL | 0 refills | Status: DC | PRN

## 2019-01-19 NOTE — Telephone Encounter (Signed)
Patient callng to let us know that he was not able to get Oxycodone & Methadone that was sent today to [facility]. They do not carry these medications. [facility] [address] [facility] [place] phone is [phone]. Patient phone is [phone]

## 2019-01-19 NOTE — Telephone Encounter (Signed)
Pt has called re: the refill for the methadone and oxycodone.  Pt states [place] [place] [facility] [place] Does not carry these medications.  Pt is asking if [facility] [address]

## 2019-01-19 NOTE — Telephone Encounter (Signed)
The prescriptions for methadone and oxycodone were sent in.

## 2019-01-19 NOTE — Addendum Note (Signed)
Addended by: Verlin Grills T on: 01/19/2019 02:33 PM   Modules accepted: Orders

## 2019-01-19 NOTE — Addendum Note (Signed)
Addended by: Kathrynn Ducking on: 01/19/2019 07:09 AM   Modules accepted: Orders

## 2019-01-19 NOTE — Addendum Note (Signed)
Addended by: Verlin Grills T on: 01/19/2019 12:35 PM   Modules accepted: Orders

## 2019-02-01 DIAGNOSIS — J328 Other chronic sinusitis: Secondary | ICD-10-CM | POA: Diagnosis not present

## 2019-02-01 DIAGNOSIS — J342 Deviated nasal septum: Secondary | ICD-10-CM

## 2019-02-01 DIAGNOSIS — J438 Other emphysema: Secondary | ICD-10-CM | POA: Diagnosis not present

## 2019-02-01 DIAGNOSIS — J984 Other disorders of lung: Secondary | ICD-10-CM

## 2019-02-01 HISTORY — DX: Other disorders of lung: J98.4

## 2019-02-01 HISTORY — DX: Deviated nasal septum: J34.2

## 2019-02-15 ENCOUNTER — Telehealth: Payer: Self-pay | Admitting: Neurology

## 2019-02-15 DIAGNOSIS — G894 Chronic pain syndrome: Secondary | ICD-10-CM

## 2019-02-15 NOTE — Telephone Encounter (Signed)
Pt request refills for methadone (DOLOPHINE) 10 MG tablet and oxyCODONE (ROXICODONE) 15 MG immediate release tablet [facility] [place]

## 2019-02-15 NOTE — Telephone Encounter (Signed)
[place] drug registry verified. Both medications were last refilled on 01/19/19 for a 30 day supply provided by Dr. Jannifer Franklin. Pt has been compliant with f/u's.

## 2019-02-16 MED ORDER — OXYCODONE HCL 15 MG PO TABS: 15.0000 mg | ORAL_TABLET | Freq: Four times a day (QID) | ORAL | 0 refills | Status: DC | PRN

## 2019-02-16 MED ORDER — METHADONE HCL 10 MG PO TABS: ORAL_TABLET | ORAL | 0 refills | Status: DC

## 2019-02-16 NOTE — Telephone Encounter (Signed)
The prescriptions for morphine and oxycodone will be sent in.

## 2019-03-15 ENCOUNTER — Telehealth: Payer: Self-pay | Admitting: Neurology

## 2019-03-15 DIAGNOSIS — G894 Chronic pain syndrome: Secondary | ICD-10-CM

## 2019-03-15 NOTE — Telephone Encounter (Signed)
Pt has called for a refill on methadone (DOLOPHINE) 10 MG tablet oxyCODONE (ROXICODONE) 15 MG immediate release tablet  [id number]

## 2019-03-15 NOTE — Telephone Encounter (Addendum)
[place] drug registry verified. Last refill for oxycodone 15 mg was 02/16/19 # 120 for a 30 day and Methadone 10 mg was 02/16/19 # 150 for a 30 day. Both prescriptions were written by Dr. Jannifer Franklin.

## 2019-03-16 MED ORDER — OXYCODONE HCL 15 MG PO TABS: 15.0000 mg | ORAL_TABLET | Freq: Four times a day (QID) | ORAL | 0 refills | Status: DC | PRN

## 2019-03-16 MED ORDER — METHADONE HCL 10 MG PO TABS: ORAL_TABLET | ORAL | 0 refills | Status: DC

## 2019-03-16 NOTE — Telephone Encounter (Signed)
Pt called in and sated he would like med sent to [facility], [place]

## 2019-03-16 NOTE — Telephone Encounter (Signed)
Pt has called to inform that [facility] just [facility] with [id number]  And they have not received the refill request for the methadone (DOLOPHINE) 10 MG tablet and the  oxyCODONE (ROXICODONE) 15 MG immediate release tablet

## 2019-03-16 NOTE — Telephone Encounter (Signed)
Pt's rx for methadone and oxycodone were sent to [facility] in [place] this am.  I contacted [facility] and rep confirmed the rx's were received. Methadone is ready for the pt to pick up but oxycodone is on back order. I contacted the pt and advised of such and he verbalized understanding. Pt had no further question's or concerns at this time.

## 2019-03-16 NOTE — Addendum Note (Signed)
Addended by: Kathrynn Ducking on: 03/16/2019 07:09 AM   Modules accepted: Orders

## 2019-03-16 NOTE — Telephone Encounter (Signed)
The [facility] registry was checked, the prescription for methadone and oxycodone were refilled.

## 2019-03-17 MED ORDER — OXYCODONE HCL 15 MG PO TABS: 15.0000 mg | ORAL_TABLET | Freq: Four times a day (QID) | ORAL | 0 refills | Status: DC | PRN

## 2019-03-17 NOTE — Telephone Encounter (Signed)
Oxycodone that was sent to [facility] is on back order and cannot be dispensed. Pt is requesting another rx be sent to [facility] in [place] (pharmacy added to preferred pharm list). Will fwd to MD to send if appropriate.

## 2019-03-17 NOTE — Addendum Note (Signed)
Addended by: Kathrynn Ducking on: 03/17/2019 04:57 PM   Modules accepted: Orders

## 2019-03-17 NOTE — Telephone Encounter (Signed)
Pt states that the [facility] in [place], [place] has his oxyCODONE (ROXICODONE) 15 MG immediate release tablet [phone]

## 2019-03-17 NOTE — Telephone Encounter (Signed)
The oxycodone will be sent to [facility].

## 2019-04-12 ENCOUNTER — Telehealth: Payer: Self-pay | Admitting: Neurology

## 2019-04-12 DIAGNOSIS — G894 Chronic pain syndrome: Secondary | ICD-10-CM

## 2019-04-12 NOTE — Telephone Encounter (Signed)
Medication refill will be due on 14 Apr 2019, I will call in the prescriptions then.

## 2019-04-12 NOTE — Telephone Encounter (Signed)
Pt is needing a refill on his methadone (DOLOPHINE) 10 MG tablet sent to [facility] oxyCODONE (ROXICODONE) 15 MG immediate release tablet sent to [facility] on [address] in [place], [place]

## 2019-04-12 NOTE — Telephone Encounter (Signed)
[place] drug registry verified. Last refill for Methadone 10 mg was 03/16/19 # 150 for a 30 day supply provided by Dr. Jannifer Franklin. Oxycodone 15 mg was last refilled on 03/17/19 # 120 for a 30 day supply.   Pt is requesting Methadone be refilled to [facility]. Pt is requesting Oxycodone be refilled to [facility], [place]

## 2019-04-13 NOTE — Telephone Encounter (Signed)
Pt called for the status of the refills, the update from Dr Jannifer Franklin was shared with him and he had no other questions.

## 2019-04-13 NOTE — Telephone Encounter (Signed)
Noted  

## 2019-04-14 MED ORDER — OXYCODONE HCL 15 MG PO TABS: 15.0000 mg | ORAL_TABLET | Freq: Four times a day (QID) | ORAL | 0 refills | Status: DC | PRN

## 2019-04-14 MED ORDER — METHADONE HCL 10 MG PO TABS: ORAL_TABLET | ORAL | 0 refills | Status: DC

## 2019-04-14 NOTE — Addendum Note (Signed)
Addended by: Kathrynn Ducking on: 04/14/2019 08:40 AM   Modules accepted: Orders

## 2019-04-14 NOTE — Telephone Encounter (Signed)
The prescriptions were sent in.

## 2019-04-29 DIAGNOSIS — R600 Localized edema: Secondary | ICD-10-CM

## 2019-04-29 DIAGNOSIS — I10 Essential (primary) hypertension: Secondary | ICD-10-CM | POA: Diagnosis not present

## 2019-04-29 HISTORY — DX: Localized edema: R60.0

## 2019-05-04 DIAGNOSIS — R600 Localized edema: Secondary | ICD-10-CM | POA: Diagnosis not present

## 2019-05-04 DIAGNOSIS — I10 Essential (primary) hypertension: Secondary | ICD-10-CM | POA: Diagnosis not present

## 2019-05-10 ENCOUNTER — Telehealth: Payer: Self-pay | Admitting: Neurology

## 2019-05-10 DIAGNOSIS — G894 Chronic pain syndrome: Secondary | ICD-10-CM

## 2019-05-10 NOTE — Telephone Encounter (Signed)
Pt is calling in for a refill of methadone (DOLOPHINE) 10 MG tablet and oxyCODONE (ROXICODONE) 15 MG immediate release tablet to be sent to [facility], [place] - [address] AT [address]

## 2019-05-10 NOTE — Telephone Encounter (Signed)
The Rx is not due until 05/12/19.

## 2019-05-10 NOTE — Telephone Encounter (Signed)
[place] drug registry verified.  Last refill for Oxycodone was 04/14/19 # 120 supplied by Dr. Jannifer Franklin. Last refill for Methadone was 04/14/19 # 150 supplied by Dr. Jannifer Franklin

## 2019-05-12 MED ORDER — OXYCODONE HCL 15 MG PO TABS: 15.0000 mg | ORAL_TABLET | Freq: Four times a day (QID) | ORAL | 0 refills | Status: DC | PRN

## 2019-05-12 MED ORDER — METHADONE HCL 10 MG PO TABS: ORAL_TABLET | ORAL | 0 refills | Status: DC

## 2019-05-12 NOTE — Telephone Encounter (Signed)
The prescriptions for oxycodone and methadone will be sent in.

## 2019-05-19 ENCOUNTER — Telehealth: Payer: Self-pay

## 2019-05-19 NOTE — Telephone Encounter (Signed)
I reached out to the pt and discussed 05/20/19 visit.  Due to current COVID 19 pandemic, our office is severely reducing in office visits until further notice, in order to minimize the risk to our patients and healthcare providers.   Pt agreed to coming to office for visit. Check in process and covid 19 precautions reviewed with the pt.  Advised to arrive by 11:30 am.

## 2019-05-20 ENCOUNTER — Telehealth: Payer: Self-pay | Admitting: Neurology

## 2019-05-20 ENCOUNTER — Ambulatory Visit: Payer: PPO | Admitting: Neurology

## 2019-05-20 NOTE — Telephone Encounter (Signed)
FYI- Pt called in and stated that he has a fever and is very stopped up and congested , appt has been r/s

## 2019-05-20 NOTE — Telephone Encounter (Signed)
This patient canceled the same day of a revisit appointment. 

## 2019-05-20 NOTE — Telephone Encounter (Signed)
Noted thank you

## 2019-05-26 DIAGNOSIS — R05 Cough: Secondary | ICD-10-CM | POA: Diagnosis not present

## 2019-05-26 DIAGNOSIS — J328 Other chronic sinusitis: Secondary | ICD-10-CM | POA: Diagnosis not present

## 2019-05-26 DIAGNOSIS — J438 Other emphysema: Secondary | ICD-10-CM | POA: Diagnosis not present

## 2019-05-26 DIAGNOSIS — R0981 Nasal congestion: Secondary | ICD-10-CM | POA: Diagnosis not present

## 2019-05-26 DIAGNOSIS — Z20828 Contact with and (suspected) exposure to other viral communicable diseases: Secondary | ICD-10-CM | POA: Diagnosis not present

## 2019-05-26 DIAGNOSIS — J984 Other disorders of lung: Secondary | ICD-10-CM | POA: Diagnosis not present

## 2019-06-08 ENCOUNTER — Telehealth: Payer: Self-pay | Admitting: Neurology

## 2019-06-08 DIAGNOSIS — G894 Chronic pain syndrome: Secondary | ICD-10-CM

## 2019-06-08 NOTE — Telephone Encounter (Signed)
Pt is requesting a refill of methadone (DOLOPHINE) 10 MG tablet and oxyCODONE (ROXICODONE) 15 MG immediate release tablet , to be sent to [facility], [place].

## 2019-06-08 NOTE — Telephone Encounter (Signed)
[place] drug registry verified. Last refills were given on 05/12/19 one for 30 day the other for 28 day supply. Both refills were supplied by Dr. Jannifer Franklin.

## 2019-06-09 MED ORDER — METHADONE HCL 10 MG PO TABS: ORAL_TABLET | ORAL | 0 refills | Status: DC

## 2019-06-09 MED ORDER — OXYCODONE HCL 15 MG PO TABS: 15.0000 mg | ORAL_TABLET | Freq: Four times a day (QID) | ORAL | 0 refills | Status: DC | PRN

## 2019-06-09 NOTE — Telephone Encounter (Signed)
I will refill the methadone and oxycodone.

## 2019-07-07 ENCOUNTER — Telehealth: Payer: Self-pay | Admitting: Neurology

## 2019-07-07 DIAGNOSIS — G894 Chronic pain syndrome: Secondary | ICD-10-CM

## 2019-07-07 MED ORDER — OXYCODONE HCL 15 MG PO TABS: 15.0000 mg | ORAL_TABLET | Freq: Four times a day (QID) | ORAL | 0 refills | Status: AC | PRN

## 2019-07-07 MED ORDER — METHADONE HCL 10 MG PO TABS: ORAL_TABLET | ORAL | 0 refills | Status: AC

## 2019-07-07 NOTE — Telephone Encounter (Signed)
The prescription for oxycodone and methadone will be sent in.

## 2019-07-07 NOTE — Telephone Encounter (Signed)
[place] drug registry has been verified. Last refill was 06/09/2019 both for a 30 day supply by Dr. Jannifer Franklin.

## 2019-07-07 NOTE — Telephone Encounter (Signed)
Pt is needing a refill on his methadone (DOLOPHINE) 10 MG tablet and the oxyCODONE (ROXICODONE) 15 MG immediate release tablet sent to [facility] in [place]

## 2019-07-08 ENCOUNTER — Ambulatory Visit: Payer: Self-pay | Admitting: Neurology

## 2019-07-20 ENCOUNTER — Ambulatory Visit (INDEPENDENT_AMBULATORY_CARE_PROVIDER_SITE_OTHER): Payer: PPO | Admitting: Neurology

## 2019-07-20 ENCOUNTER — Encounter: Payer: Self-pay | Admitting: Neurology

## 2019-07-20 ENCOUNTER — Other Ambulatory Visit: Payer: Self-pay

## 2019-07-20 VITALS — BP 147/79 | HR 74 | Ht 69.5 in | Wt 176.0 lb

## 2019-07-20 DIAGNOSIS — G894 Chronic pain syndrome: Secondary | ICD-10-CM | POA: Diagnosis not present

## 2019-07-20 DIAGNOSIS — G8929 Other chronic pain: Secondary | ICD-10-CM | POA: Diagnosis not present

## 2019-07-20 DIAGNOSIS — M545 Low back pain: Secondary | ICD-10-CM

## 2019-07-20 DIAGNOSIS — Z79891 Long term (current) use of opiate analgesic: Secondary | ICD-10-CM | POA: Diagnosis not present

## 2019-07-20 NOTE — Progress Notes (Signed)
Reason for visit: Chronic back pain  Randy Chase is an 68 y.o. male  History of present illness:  Mr. Gasper is a 68 year old right-handed white male with a history of chronic pain syndrome, with chronic back pain issues.  The patient returns today indicating his wife unexpectedly passed away several months ago.  He is still grieving this event.  The patient has had chronic shortness of breath and sinus issues, he has had frequent sinus infections.  He does occasionally have sharp pains involving the right foot that may last up to 30 seconds.  He is having some anxiety issues at nighttime, he will wake up in have trouble getting back to sleep.  He will fall on occasion, uses a cane to get around.  He was referred to a pain center in December 2019, he never heard anything about the appointment.  He remains on chronic opiate therapy.  Past Medical History:  Diagnosis Date  . Allergic rhinitis   . Anxiety   . Arthritis   . Bladder cancer ([place])   . BMI 25.0-25.9,adult   . Colitis   . Colonic polyp   . Hematuria   . Hernia, inguinal, right   . Low back pain   . Lumbar disc narrowing   . Tobacco use     Past Surgical History:  Procedure Laterality Date  . bladder cancer    . HERNIA REPAIR     Right inguinal  . SPLENIC ARTERY EMBOLIZATION     Splenic resection    Family History  Problem Relation Age of Onset  . Colon cancer Mother   . Arthritis Mother   . Asthma Father   . Arthritis Father   . Stomach cancer Paternal Grandfather     Social history:  reports that he has been smoking cigarettes. He has been smoking about 0.25 packs per day. He has never used smokeless tobacco. He reports current alcohol use of about 28.0 standard drinks of alcohol per week. He reports that he does not use drugs.    Allergies  Allergen Reactions  . Reglan [Metoclopramide]     Medications:  Prior to Admission medications   Medication Sig Start Date End Date Taking? Authorizing Provider   albuterol (PROAIR HFA) 108 (90 Base) MCG/ACT inhaler Inhale 2 puffs into the lungs every 6 (six) hours as needed for wheezing or shortness of breath.   Yes [provider]  aspirin EC 325 MG tablet Take 1 tablet (325 mg total) by mouth daily. 11/11/17  Yes Revankar, Reita Cliche, MD  diltiazem (CARDIZEM) 60 MG tablet Take 60 mg by mouth 3 (three) times daily.   Yes [provider]  escitalopram (LEXAPRO) 10 MG tablet Take 10 mg by mouth daily.   Yes [provider]  fluticasone (FLONASE) 50 MCG/ACT nasal spray Place daily into both nostrils.   Yes [provider]  loratadine (CLARITIN) 10 MG tablet Take 10 mg by mouth daily.   Yes [provider]  methadone (DOLOPHINE) 10 MG tablet Take 2 tablets in the morning and 3 tablets in the evening 07/07/19  Yes Kathrynn Ducking, MD  naloxone [facility]) nasal spray 4 mg/0.1 mL Use as directed 09/30/18  Yes Kathrynn Ducking, MD  oxyCODONE (ROXICODONE) 15 MG immediate release tablet Take 1 tablet (15 mg total) by mouth every 6 (six) hours as needed. Must last 28 days 07/07/19  Yes Kathrynn Ducking, MD    ROS:  Out of a complete 14 system  review of symptoms, the patient complains only of the following symptoms, and all other reviewed systems are negative.  Foot pain, back pain Anxiety Insomnia  Blood pressure (!) 147/79, pulse 74, height 5' 9.5" (1.765 m), weight 176 lb (79.8 kg).  Physical Exam  General: The patient is alert and cooperative at the time of the examination.  Skin: 1+ edema in the lower extremities below the knees is seen.   Neurologic Exam  Mental status: The patient is alert and oriented x 3 at the time of the examination. The patient has apparent normal recent and remote memory, with an apparently normal attention span and concentration ability.   Cranial nerves: Facial symmetry is present. Speech is normal, no aphasia or dysarthria is noted. Extraocular movements are full. Visual fields  are full.  Motor: The patient has good strength in all 4 extremities.  Sensory examination: Soft touch sensation is symmetric on the face, arms, and legs.  Coordination: The patient has good finger-nose-finger and heel-to-shin bilaterally.  Gait and station: The patient has a normal gait. Tandem gait is normal. Romberg is negative. No drift is seen.  Reflexes: Deep tendon reflexes are symmetric.   Assessment/Plan:  1.  Chronic pain syndrome, back pain  The patient will continue on his methadone and use oxycodone for breakthrough pain.  He will be referred again to a pain center, [facility].  He will follow-up in 6 months, a urine drug screen will be done today.  Jill Alexanders MD 07/20/2019 4:39 PM  [facility] [address] [place], [place] [address]  Phone [phone] Fax [phone]

## 2019-07-22 LAB — COMPLIANCE DRUG ANALYSIS, UR

## 2019-08-03 DIAGNOSIS — M545 Low back pain: Secondary | ICD-10-CM | POA: Diagnosis not present

## 2019-08-03 DIAGNOSIS — M542 Cervicalgia: Secondary | ICD-10-CM | POA: Diagnosis not present

## 2019-08-03 DIAGNOSIS — Z79899 Other long term (current) drug therapy: Secondary | ICD-10-CM | POA: Diagnosis not present

## 2019-08-03 DIAGNOSIS — Z1159 Encounter for screening for other viral diseases: Secondary | ICD-10-CM | POA: Diagnosis not present

## 2019-08-03 DIAGNOSIS — G894 Chronic pain syndrome: Secondary | ICD-10-CM | POA: Diagnosis not present

## 2019-08-03 DIAGNOSIS — M129 Arthropathy, unspecified: Secondary | ICD-10-CM | POA: Diagnosis not present

## 2019-08-03 DIAGNOSIS — G8929 Other chronic pain: Secondary | ICD-10-CM | POA: Diagnosis not present

## 2019-08-04 ENCOUNTER — Telehealth: Payer: Self-pay | Admitting: Neurology

## 2019-08-04 NOTE — Telephone Encounter (Signed)
Patient is being ing seen at [address] location . Pain Mgt.

## 2019-08-04 NOTE — Telephone Encounter (Signed)
Noted  

## 2019-08-10 DIAGNOSIS — M5137 Other intervertebral disc degeneration, lumbosacral region: Secondary | ICD-10-CM | POA: Diagnosis not present

## 2019-08-10 DIAGNOSIS — J018 Other acute sinusitis: Secondary | ICD-10-CM | POA: Diagnosis not present

## 2019-08-10 DIAGNOSIS — M545 Low back pain: Secondary | ICD-10-CM | POA: Diagnosis not present

## 2019-08-10 DIAGNOSIS — Z79891 Long term (current) use of opiate analgesic: Secondary | ICD-10-CM | POA: Diagnosis not present

## 2019-08-10 DIAGNOSIS — M542 Cervicalgia: Secondary | ICD-10-CM | POA: Diagnosis not present

## 2019-08-10 DIAGNOSIS — G8929 Other chronic pain: Secondary | ICD-10-CM | POA: Diagnosis not present

## 2019-09-01 DIAGNOSIS — Z122 Encounter for screening for malignant neoplasm of respiratory organs: Secondary | ICD-10-CM | POA: Diagnosis not present

## 2019-09-03 DIAGNOSIS — J329 Chronic sinusitis, unspecified: Secondary | ICD-10-CM | POA: Diagnosis not present

## 2019-09-03 DIAGNOSIS — J342 Deviated nasal septum: Secondary | ICD-10-CM | POA: Diagnosis not present

## 2019-09-09 ENCOUNTER — Other Ambulatory Visit: Payer: Self-pay | Admitting: Otolaryngology

## 2019-09-09 DIAGNOSIS — J328 Other chronic sinusitis: Secondary | ICD-10-CM

## 2019-09-09 DIAGNOSIS — Z79899 Other long term (current) drug therapy: Secondary | ICD-10-CM | POA: Diagnosis not present

## 2019-09-09 DIAGNOSIS — Z7189 Other specified counseling: Secondary | ICD-10-CM | POA: Diagnosis not present

## 2019-09-09 DIAGNOSIS — G8929 Other chronic pain: Secondary | ICD-10-CM | POA: Diagnosis not present

## 2019-09-09 DIAGNOSIS — G894 Chronic pain syndrome: Secondary | ICD-10-CM | POA: Diagnosis not present

## 2019-09-09 DIAGNOSIS — M542 Cervicalgia: Secondary | ICD-10-CM | POA: Diagnosis not present

## 2019-09-09 DIAGNOSIS — M5137 Other intervertebral disc degeneration, lumbosacral region: Secondary | ICD-10-CM | POA: Diagnosis not present

## 2019-09-14 ENCOUNTER — Ambulatory Visit
Admission: RE | Admit: 2019-09-14 | Discharge: 2019-09-14 | Disposition: A | Discharge status: Home / Self Care | Payer: PPO | Source: Ambulatory Visit | Attending: Otolaryngology | Admitting: Otolaryngology

## 2019-09-14 DIAGNOSIS — J329 Chronic sinusitis, unspecified: Secondary | ICD-10-CM | POA: Diagnosis not present

## 2019-09-14 DIAGNOSIS — J328 Other chronic sinusitis: Secondary | ICD-10-CM

## 2019-09-15 ENCOUNTER — Other Ambulatory Visit: Payer: PPO

## 2019-09-17 DIAGNOSIS — I719 Aortic aneurysm of unspecified site, without rupture: Secondary | ICD-10-CM | POA: Diagnosis not present

## 2019-09-17 DIAGNOSIS — I6529 Occlusion and stenosis of unspecified carotid artery: Secondary | ICD-10-CM | POA: Diagnosis not present

## 2019-10-01 DIAGNOSIS — R1084 Generalized abdominal pain: Secondary | ICD-10-CM | POA: Diagnosis not present

## 2019-10-08 DIAGNOSIS — M5137 Other intervertebral disc degeneration, lumbosacral region: Secondary | ICD-10-CM | POA: Diagnosis not present

## 2019-10-08 DIAGNOSIS — Z1159 Encounter for screening for other viral diseases: Secondary | ICD-10-CM | POA: Diagnosis not present

## 2019-10-08 DIAGNOSIS — I6522 Occlusion and stenosis of left carotid artery: Secondary | ICD-10-CM | POA: Diagnosis not present

## 2019-10-08 DIAGNOSIS — M542 Cervicalgia: Secondary | ICD-10-CM | POA: Diagnosis not present

## 2019-10-08 DIAGNOSIS — Z79899 Other long term (current) drug therapy: Secondary | ICD-10-CM | POA: Diagnosis not present

## 2019-10-08 DIAGNOSIS — G894 Chronic pain syndrome: Secondary | ICD-10-CM | POA: Diagnosis not present

## 2019-11-05 DIAGNOSIS — Z79899 Other long term (current) drug therapy: Secondary | ICD-10-CM | POA: Diagnosis not present

## 2019-11-05 DIAGNOSIS — Z1159 Encounter for screening for other viral diseases: Secondary | ICD-10-CM | POA: Diagnosis not present

## 2019-11-05 DIAGNOSIS — M542 Cervicalgia: Secondary | ICD-10-CM | POA: Diagnosis not present

## 2019-11-05 DIAGNOSIS — I10 Essential (primary) hypertension: Secondary | ICD-10-CM | POA: Diagnosis not present

## 2019-11-05 DIAGNOSIS — G894 Chronic pain syndrome: Secondary | ICD-10-CM | POA: Diagnosis not present

## 2019-11-05 DIAGNOSIS — Z03818 Encounter for observation for suspected exposure to other biological agents ruled out: Secondary | ICD-10-CM | POA: Diagnosis not present

## 2019-12-06 DIAGNOSIS — M545 Low back pain: Secondary | ICD-10-CM | POA: Diagnosis not present

## 2019-12-06 DIAGNOSIS — G894 Chronic pain syndrome: Secondary | ICD-10-CM | POA: Diagnosis not present

## 2019-12-06 DIAGNOSIS — M5137 Other intervertebral disc degeneration, lumbosacral region: Secondary | ICD-10-CM | POA: Diagnosis not present

## 2019-12-06 DIAGNOSIS — R062 Wheezing: Secondary | ICD-10-CM | POA: Diagnosis not present

## 2019-12-06 DIAGNOSIS — Z79899 Other long term (current) drug therapy: Secondary | ICD-10-CM | POA: Diagnosis not present

## 2019-12-27 DIAGNOSIS — R0981 Nasal congestion: Secondary | ICD-10-CM | POA: Diagnosis not present

## 2019-12-27 DIAGNOSIS — Z20822 Contact with and (suspected) exposure to covid-19: Secondary | ICD-10-CM | POA: Diagnosis not present

## 2019-12-27 DIAGNOSIS — R05 Cough: Secondary | ICD-10-CM | POA: Diagnosis not present

## 2019-12-27 DIAGNOSIS — J441 Chronic obstructive pulmonary disease with (acute) exacerbation: Secondary | ICD-10-CM | POA: Insufficient documentation

## 2019-12-27 HISTORY — DX: Chronic obstructive pulmonary disease with (acute) exacerbation: J44.1

## 2020-01-04 DIAGNOSIS — M545 Low back pain: Secondary | ICD-10-CM | POA: Diagnosis not present

## 2020-01-04 DIAGNOSIS — G8929 Other chronic pain: Secondary | ICD-10-CM | POA: Diagnosis not present

## 2020-01-04 DIAGNOSIS — Z79899 Other long term (current) drug therapy: Secondary | ICD-10-CM | POA: Diagnosis not present

## 2020-01-04 DIAGNOSIS — M542 Cervicalgia: Secondary | ICD-10-CM | POA: Diagnosis not present

## 2020-01-04 DIAGNOSIS — M5137 Other intervertebral disc degeneration, lumbosacral region: Secondary | ICD-10-CM | POA: Diagnosis not present

## 2020-01-24 DIAGNOSIS — J441 Chronic obstructive pulmonary disease with (acute) exacerbation: Secondary | ICD-10-CM | POA: Diagnosis not present

## 2020-01-26 ENCOUNTER — Ambulatory Visit: Payer: PPO | Admitting: Neurology

## 2020-02-01 DIAGNOSIS — M542 Cervicalgia: Secondary | ICD-10-CM | POA: Diagnosis not present

## 2020-02-01 DIAGNOSIS — Z79899 Other long term (current) drug therapy: Secondary | ICD-10-CM | POA: Diagnosis not present

## 2020-02-01 DIAGNOSIS — M5137 Other intervertebral disc degeneration, lumbosacral region: Secondary | ICD-10-CM | POA: Diagnosis not present

## 2020-02-01 DIAGNOSIS — G894 Chronic pain syndrome: Secondary | ICD-10-CM | POA: Diagnosis not present

## 2020-02-01 DIAGNOSIS — G8929 Other chronic pain: Secondary | ICD-10-CM | POA: Diagnosis not present

## 2020-02-23 DIAGNOSIS — E7800 Pure hypercholesterolemia, unspecified: Secondary | ICD-10-CM | POA: Insufficient documentation

## 2020-02-23 DIAGNOSIS — R7303 Prediabetes: Secondary | ICD-10-CM | POA: Insufficient documentation

## 2020-02-23 HISTORY — DX: Prediabetes: R73.03

## 2020-02-23 HISTORY — DX: Pure hypercholesterolemia, unspecified: E78.00

## 2020-03-01 DIAGNOSIS — M542 Cervicalgia: Secondary | ICD-10-CM | POA: Diagnosis not present

## 2020-03-01 DIAGNOSIS — M5137 Other intervertebral disc degeneration, lumbosacral region: Secondary | ICD-10-CM | POA: Diagnosis not present

## 2020-03-01 DIAGNOSIS — Z79899 Other long term (current) drug therapy: Secondary | ICD-10-CM | POA: Diagnosis not present

## 2020-03-01 DIAGNOSIS — M545 Low back pain: Secondary | ICD-10-CM | POA: Diagnosis not present

## 2020-03-01 DIAGNOSIS — G894 Chronic pain syndrome: Secondary | ICD-10-CM | POA: Diagnosis not present

## 2020-03-24 DIAGNOSIS — I10 Essential (primary) hypertension: Secondary | ICD-10-CM | POA: Diagnosis not present

## 2020-03-24 DIAGNOSIS — Z13220 Encounter for screening for lipoid disorders: Secondary | ICD-10-CM | POA: Diagnosis not present

## 2020-03-24 DIAGNOSIS — Z125 Encounter for screening for malignant neoplasm of prostate: Secondary | ICD-10-CM | POA: Diagnosis not present

## 2020-03-24 DIAGNOSIS — R7303 Prediabetes: Secondary | ICD-10-CM | POA: Diagnosis not present

## 2020-03-24 DIAGNOSIS — R600 Localized edema: Secondary | ICD-10-CM | POA: Diagnosis not present

## 2020-03-24 DIAGNOSIS — I480 Paroxysmal atrial fibrillation: Secondary | ICD-10-CM | POA: Diagnosis not present

## 2020-03-24 DIAGNOSIS — R7301 Impaired fasting glucose: Secondary | ICD-10-CM | POA: Diagnosis not present

## 2020-03-24 DIAGNOSIS — I2510 Atherosclerotic heart disease of native coronary artery without angina pectoris: Secondary | ICD-10-CM | POA: Diagnosis not present

## 2020-03-27 DIAGNOSIS — K439 Ventral hernia without obstruction or gangrene: Secondary | ICD-10-CM | POA: Diagnosis not present

## 2020-03-27 DIAGNOSIS — L578 Other skin changes due to chronic exposure to nonionizing radiation: Secondary | ICD-10-CM | POA: Diagnosis not present

## 2020-03-27 DIAGNOSIS — E7800 Pure hypercholesterolemia, unspecified: Secondary | ICD-10-CM | POA: Diagnosis not present

## 2020-03-27 DIAGNOSIS — F330 Major depressive disorder, recurrent, mild: Secondary | ICD-10-CM | POA: Insufficient documentation

## 2020-03-27 DIAGNOSIS — J438 Other emphysema: Secondary | ICD-10-CM | POA: Diagnosis not present

## 2020-03-27 DIAGNOSIS — Z23 Encounter for immunization: Secondary | ICD-10-CM | POA: Diagnosis not present

## 2020-03-27 DIAGNOSIS — Z87891 Personal history of nicotine dependence: Secondary | ICD-10-CM | POA: Diagnosis not present

## 2020-03-27 DIAGNOSIS — Z0000 Encounter for general adult medical examination without abnormal findings: Secondary | ICD-10-CM | POA: Diagnosis not present

## 2020-03-27 DIAGNOSIS — F419 Anxiety disorder, unspecified: Secondary | ICD-10-CM | POA: Diagnosis not present

## 2020-03-27 HISTORY — DX: Major depressive disorder, recurrent, mild: F33.0

## 2020-03-30 DIAGNOSIS — G8929 Other chronic pain: Secondary | ICD-10-CM | POA: Diagnosis not present

## 2020-03-30 DIAGNOSIS — G894 Chronic pain syndrome: Secondary | ICD-10-CM | POA: Diagnosis not present

## 2020-03-30 DIAGNOSIS — M5137 Other intervertebral disc degeneration, lumbosacral region: Secondary | ICD-10-CM | POA: Diagnosis not present

## 2020-03-30 DIAGNOSIS — Z79899 Other long term (current) drug therapy: Secondary | ICD-10-CM | POA: Diagnosis not present

## 2020-03-30 DIAGNOSIS — M542 Cervicalgia: Secondary | ICD-10-CM | POA: Diagnosis not present

## 2020-04-18 DIAGNOSIS — K432 Incisional hernia without obstruction or gangrene: Secondary | ICD-10-CM | POA: Diagnosis not present

## 2020-04-18 DIAGNOSIS — Z1211 Encounter for screening for malignant neoplasm of colon: Secondary | ICD-10-CM | POA: Diagnosis not present

## 2020-04-18 DIAGNOSIS — R1030 Lower abdominal pain, unspecified: Secondary | ICD-10-CM

## 2020-04-18 DIAGNOSIS — K439 Ventral hernia without obstruction or gangrene: Secondary | ICD-10-CM | POA: Diagnosis not present

## 2020-04-18 HISTORY — DX: Lower abdominal pain, unspecified: R10.30

## 2020-04-20 DIAGNOSIS — F17210 Nicotine dependence, cigarettes, uncomplicated: Secondary | ICD-10-CM | POA: Diagnosis not present

## 2020-04-20 DIAGNOSIS — Z8551 Personal history of malignant neoplasm of bladder: Secondary | ICD-10-CM | POA: Diagnosis not present

## 2020-04-20 DIAGNOSIS — J439 Emphysema, unspecified: Secondary | ICD-10-CM | POA: Diagnosis not present

## 2020-04-20 DIAGNOSIS — Z79891 Long term (current) use of opiate analgesic: Secondary | ICD-10-CM | POA: Diagnosis not present

## 2020-04-20 DIAGNOSIS — Z79899 Other long term (current) drug therapy: Secondary | ICD-10-CM | POA: Diagnosis not present

## 2020-04-20 DIAGNOSIS — R109 Unspecified abdominal pain: Secondary | ICD-10-CM | POA: Diagnosis not present

## 2020-04-20 DIAGNOSIS — K5900 Constipation, unspecified: Secondary | ICD-10-CM | POA: Diagnosis not present

## 2020-04-28 DIAGNOSIS — Z79899 Other long term (current) drug therapy: Secondary | ICD-10-CM | POA: Diagnosis not present

## 2020-04-28 DIAGNOSIS — M545 Low back pain: Secondary | ICD-10-CM | POA: Diagnosis not present

## 2020-04-28 DIAGNOSIS — G894 Chronic pain syndrome: Secondary | ICD-10-CM | POA: Diagnosis not present

## 2020-04-28 DIAGNOSIS — M5137 Other intervertebral disc degeneration, lumbosacral region: Secondary | ICD-10-CM | POA: Diagnosis not present

## 2020-05-01 DIAGNOSIS — R5383 Other fatigue: Secondary | ICD-10-CM | POA: Diagnosis not present

## 2020-05-01 DIAGNOSIS — G4733 Obstructive sleep apnea (adult) (pediatric): Secondary | ICD-10-CM | POA: Diagnosis not present

## 2020-05-01 DIAGNOSIS — J301 Allergic rhinitis due to pollen: Secondary | ICD-10-CM | POA: Diagnosis not present

## 2020-05-01 DIAGNOSIS — J4540 Moderate persistent asthma, uncomplicated: Secondary | ICD-10-CM | POA: Diagnosis not present

## 2020-05-04 ENCOUNTER — Telehealth: Payer: Self-pay

## 2020-05-04 NOTE — Telephone Encounter (Signed)
Pt needs appt for cardiac clearance-[id number]

## 2020-05-04 NOTE — Telephone Encounter (Signed)
Primary Cardiologist:Rajan R Revankar, MD  Chart reviewed as part of pre-operative protocol coverage. Because of Randy Chase's past medical history and time since last visit, he/she will require a follow-up visit in order to better assess preoperative cardiovascular risk.  Pre-op covering staff: - Please schedule appointment and call patient to inform them. - Please contact requesting surgeon's office via preferred method (i.e, phone, fax) to inform them of need for appointment prior to surgery.  If applicable, this message will also be routed to pharmacy pool and/or primary cardiologist for input on holding anticoagulant/antiplatelet agent as requested below so that this information is available at time of patient's appointment.   Deberah Pelton, NP  05/04/2020, 2:07 PM

## 2020-05-04 NOTE — Telephone Encounter (Signed)
   [facility] Pre-operative Risk Assessment    HEARTCARE STAFF: - Please ensure there is not already an duplicate clearance open for this procedure. - Under Visit Info/Reason for Call, type in Other and utilize the format Clearance MM/DD/YY or Clearance TBD. Do not use dashes or single digits. - If request is for dental extraction, please clarify the # of teeth to be extracted.  Request for surgical clearance:  1. What type of surgery is being performed? Colonoscopy   2. When is this surgery scheduled? ASAP   3. What type of clearance is required (medical clearance vs. Pharmacy clearance to hold med vs. Both)? Both  4. Are there any medications that need to be held prior to surgery and how long? Xarelto 3 days prior to procedure   5. Practice name and name of physician performing surgery? [facility] [place]- Dr. Jerel Shepherd   6. What is the office phone number? [phone]   7.   What is the office fax number? [phone]  8.   Anesthesia type (None, local, MAC, general) ? Not listed   Lowella Grip 05/04/2020, 1:41 PM  _________________________________________________________________   (provider comments below)

## 2020-05-04 NOTE — Telephone Encounter (Signed)
Pt does not have Xarelto on his med list and has not been seen by cardiology since January 2019.

## 2020-05-08 NOTE — Telephone Encounter (Signed)
Called pt and made appt at your [place] office 05/18/2020 4:00 PM OFFICE VISIT [name], MD Address: [place], [place],  [address] pt will arrive early with insurance card, alone wearing a mask. Pt verbalized understanding.

## 2020-05-18 ENCOUNTER — Ambulatory Visit (INDEPENDENT_AMBULATORY_CARE_PROVIDER_SITE_OTHER): Payer: PPO | Admitting: Cardiology

## 2020-05-18 ENCOUNTER — Other Ambulatory Visit: Payer: Self-pay

## 2020-05-18 ENCOUNTER — Encounter: Payer: Self-pay | Admitting: Cardiology

## 2020-05-18 VITALS — BP 138/70 | HR 77 | Ht 69.5 in | Wt 177.0 lb

## 2020-05-18 DIAGNOSIS — F17210 Nicotine dependence, cigarettes, uncomplicated: Secondary | ICD-10-CM

## 2020-05-18 DIAGNOSIS — I2510 Atherosclerotic heart disease of native coronary artery without angina pectoris: Secondary | ICD-10-CM | POA: Diagnosis not present

## 2020-05-18 DIAGNOSIS — I10 Essential (primary) hypertension: Secondary | ICD-10-CM

## 2020-05-18 DIAGNOSIS — I480 Paroxysmal atrial fibrillation: Secondary | ICD-10-CM

## 2020-05-18 NOTE — Progress Notes (Signed)
Cardiology Office Note:    Date:  05/18/2020   ID:  Randy Chase, DOB December 17, 1950, MRN [id number]  PCP:  Algis Greenhouse, MD  Cardiologist:  Jenean Lindau, MD   Referring MD: Algis Greenhouse, MD    ASSESSMENT:    1. Atherosclerosis of native coronary artery of native heart without angina pectoris   2. Essential hypertension   3. Paroxysmal atrial fibrillation (HCC)   4. Cigarette smoker    PLAN:    In order of problems listed above:  1. Preoperative risk stratification: Secondary prevention stressed with the patient.  Importance of compliance with diet medication stressed and he vocalized understanding.  In view of his multiple risk factors and sedentary lifestyle we will do a Lexiscan sestamibi to assess this.  If this is negative then he is not at high risk for coronary events during the aforementioned surgery.  Meticulous hemodynamic monitoring will further reduce the risk of coronary events. 2. Atrial fibrillation, paroxysmal:I discussed with the patient atrial fibrillation, disease process. Management and therapy including rate and rhythm control, anticoagulation benefits and potential risks were discussed extensively with the patient. Patient had multiple questions which were answered to patient's satisfaction.  As mentioned above patient is not on anticoagulation because of rectal bleeding and he understands the risk.  He is waiting to see his surgeon for this evaluation and then also planning to get his umbilical hernia surgery before he starts anticoagulation.  He has had significant bleeding he sees and followed by surgery for this.  His surgeons will guide him as to when he can start anticoagulation based on this resolution of this problem. 3. Essential hypertension: Blood pressure stable 4. Mixed dyslipidemia: Diet was emphasized and lipids followed by primary care physician. 5. Ex-smoker: Counseled to never go back to smoking and he promises not to.   Medication  Adjustments/Labs and Tests Ordered: Current medicines are reviewed at length with the patient today.  Concerns regarding medicines are outlined above.  No orders of the defined types were placed in this encounter.  No orders of the defined types were placed in this encounter.    Chief Complaint  Patient presents with   Pre-op Exam     History of Present Illness:    Randy Chase is a 69 y.o. male.  Patient has past history of atherosclerotic vascular disease, essential hypertension, paroxysmal atrial fibrillation.  He denies any problems at this time.  He leads a sedentary lifestyle.  He is quit smoking quite some time ago.  No chest pain orthopnea or PND.  He is planning to undergo umbilical hernia surgery.  At the time of my evaluation, the patient is alert awake oriented and in no distress.  He also is not on anticoagulation because he stopped his blood thinners and anticoagulants because of rectal bleeding.  This was about 3 to 4 days ago.  He is has an appointment with a surgeon for this for colonoscopy.  Past Medical History:  Diagnosis Date   Allergic rhinitis    Anxiety    Arthritis    Bladder cancer (HCC)    BMI 25.0-25.9,adult    Colitis    Colonic polyp    Hematuria    Hernia, inguinal, right    Low back pain    Lumbar disc narrowing    Tobacco use     Past Surgical History:  Procedure Laterality Date   bladder cancer     HERNIA REPAIR     Right  inguinal   SPLENIC ARTERY EMBOLIZATION     Splenic resection    Current Medications: Current Meds  Medication Sig   albuterol (PROAIR HFA) 108 (90 Base) MCG/ACT inhaler Inhale 2 puffs into the lungs every 6 (six) hours as needed for wheezing or shortness of breath.   atorvastatin (LIPITOR) 40 MG tablet Take by mouth.   diltiazem (CARDIZEM) 60 MG tablet Take 60 mg by mouth 3 (three) times daily.   escitalopram (LEXAPRO) 10 MG tablet Take 10 mg by mouth daily.   escitalopram (LEXAPRO) 20 MG tablet  Take by mouth.   fluticasone (FLONASE) 50 MCG/ACT nasal spray Place daily into both nostrils.   Fluticasone-Umeclidin-Vilant 200-62.5-25 MCG/INH AEPB Inhale into the lungs.   loratadine (CLARITIN) 10 MG tablet Take 10 mg by mouth daily.   methadone (DOLOPHINE) 10 MG tablet Take 2 tablets in the morning and 3 tablets in the evening   montelukast (SINGULAIR) 10 MG tablet Take 10 mg by mouth daily.   MOVANTIK 25 MG TABS tablet Take 25 mg by mouth daily.   naloxone (NARCAN) nasal spray 4 mg/0.1 mL Use as directed   oxyCODONE (ROXICODONE) 15 MG immediate release tablet Take 1 tablet (15 mg total) by mouth every 6 (six) hours as needed. Must last 28 days   SPIRIVA RESPIMAT 2.5 MCG/ACT AERS SMARTSIG:2 Puff(s) Via Inhaler Daily   valsartan (DIOVAN) 160 MG tablet Take by mouth.   verapamil (CALAN-SR) 240 MG CR tablet TAKE 1 TABLET BY MOUTH EVERY MORNING FORHIGH BLOOD PRESSURE, HEART AND HEART BEATS.     Allergies:   Reglan [metoclopramide]   Social History   Socioeconomic History   Marital status: Widowed    Spouse name: Not on file   Number of children: 1   Years of education: 5   Highest education level: Not on file  Occupational History   Not on file  Tobacco Use   Smoking status: Former Smoker    Packs/day: 0.25    Types: Cigarettes    Quit date: 05/18/2018    Years since quitting: 2.0   Smokeless tobacco: Never Used  Vaping Use   Vaping Use: Never used  Substance and Sexual Activity   Alcohol use: Yes    Alcohol/week: 28.0 standard drinks    Types: 20 Cans of beer, 8 Standard drinks or equivalent per week    Comment: drinks 12 packs a week   Drug use: No   Sexual activity: Not on file  Other Topics Concern   Not on file  Social History Narrative   Patient is right handed.    Patient does not drink caffeine.   Social Determinants of Health   Financial Resource Strain:    Difficulty of Paying Living Expenses:   Food Insecurity:    Worried About  Charity fundraiser in the Last Year:    Arboriculturist in the Last Year:   Transportation Needs:    Film/video editor (Medical):    Lack of Transportation (Non-Medical):   Physical Activity:    Days of Exercise per Week:    Minutes of Exercise per Session:   Stress:    Feeling of Stress :   Social Connections:    Frequency of Communication with Friends and Family:    Frequency of Social Gatherings with Friends and Family:    Attends Religious Services:    Active Member of Clubs or Organizations:    Attends Archivist Meetings:    Marital Status:  Family History: The patient's family history includes Arthritis in his father and mother; Asthma in his father; Colon cancer in his mother; Stomach cancer in his paternal grandfather.  ROS:   Please see the history of present illness.    All other systems reviewed and are negative.  EKGs/Labs/Other Studies Reviewed:    The following studies were reviewed today: EKG reveals sinus rhythm and nonspecific ST changes.   Recent Labs: No results found for requested labs within last 8760 hours.  Recent Lipid Panel No results found for: CHOL, TRIG, HDL, CHOLHDL, VLDL, LDLCALC, LDLDIRECT  Physical Exam:    VS:  BP 138/70    Pulse 77    Ht 5' 9.5" (1.765 m)    Wt 177 lb (80.3 kg)    SpO2 96%    BMI 25.76 kg/m     Wt Readings from Last 3 Encounters:  05/18/20 177 lb (80.3 kg)  07/20/19 176 lb (79.8 kg)  10/26/18 175 lb (79.4 kg)     GEN: Patient is in no acute distress HEENT: Normal NECK: No JVD; No carotid bruits LYMPHATICS: No lymphadenopathy CARDIAC: Hear sounds regular, 2/6 systolic murmur at the apex. RESPIRATORY:  Clear to auscultation without rales, wheezing or rhonchi  ABDOMEN: Soft, non-tender, non-distended MUSCULOSKELETAL:  No edema; No deformity  SKIN: Warm and dry NEUROLOGIC:  Alert and oriented x 3 PSYCHIATRIC:  Normal affect   Signed, Jenean Lindau, MD  05/18/2020 4:26 PM      [facility]

## 2020-05-19 DIAGNOSIS — M5137 Other intervertebral disc degeneration, lumbosacral region: Secondary | ICD-10-CM | POA: Diagnosis not present

## 2020-05-19 DIAGNOSIS — M542 Cervicalgia: Secondary | ICD-10-CM | POA: Diagnosis not present

## 2020-05-19 DIAGNOSIS — Z79899 Other long term (current) drug therapy: Secondary | ICD-10-CM | POA: Diagnosis not present

## 2020-05-19 DIAGNOSIS — G894 Chronic pain syndrome: Secondary | ICD-10-CM | POA: Diagnosis not present

## 2020-05-19 NOTE — Addendum Note (Signed)
Addended by: Truddie Hidden on: 05/19/2020 08:26 AM   Modules accepted: Orders

## 2020-05-19 NOTE — Patient Instructions (Addendum)
Medication Instructions:  No medication changes. *If you need a refill on your cardiac medications before your next appointment, please call your pharmacy*   Lab Work: None ordered If you have labs (blood work) drawn today and your tests are completely normal, you will receive your results only by: Marland Kitchen MyChart Message (if you have MyChart) OR . A paper copy in the mail If you have any lab test that is abnormal or we need to change your treatment, we will call you to review the results.   Testing/Procedures: Your physician has requested that you have a lexiscan myoview. For further information please visit [email or web address]. Please follow instruction sheet, as given.  The test will take approximately 3 to 4 hours to complete; you may bring reading material.  If someone comes with you to your appointment, they will need to remain in the main lobby due to limited space in the testing area.  How to prepare for your Myocardial Perfusion Test: . Do not eat or drink 3 hours prior to your test, except you may have water. . Do not consume products containing caffeine (regular or decaffeinated) 12 hours prior to your test. (ex: coffee, chocolate, sodas, tea). . Do bring a list of your current medications with you.  If not listed below, you may take your medications as normal. . Do wear comfortable clothes (no dresses or overalls) and walking shoes, tennis shoes preferred (No heels or open toe shoes are allowed). . Do NOT wear cologne, perfume, aftershave, or lotions (deodorant is allowed). . If these instructions are not followed, your test will have to be rescheduled.    Follow-Up: At [facility], you and your health needs are our priority.  As part of our continuing mission to provide you with exceptional heart care, we have created designated Provider Care Teams.  These Care Teams include your primary Cardiologist (physician) and Advanced Practice Providers (APPs -  Physician Assistants and  Nurse Practitioners) who all work together to provide you with the care you need, when you need it.  We recommend signing up for the patient portal called "MyChart".  Sign up information is provided on this After Visit Summary.  MyChart is used to connect with patients for Virtual Visits (Telemedicine).  Patients are able to view lab/test results, encounter notes, upcoming appointments, etc.  Non-urgent messages can be sent to your provider as well.   To learn more about what you can do with MyChart, go to [email or web address].    Your next appointment:   6 month(s)  The format for your next appointment:   In Person  Provider:   [name], MD   Other Instructions  Nuclear Medicine Exam A nuclear medicine exam is a safe and painless imaging test. It helps your health care provider detect and diagnose diseases. It also provides information about the ways your organs work and how they are structured. For a nuclear medicine exam, you will be given a radioactive tracer. This substance is absorbed by your body's organs. A large scanning machine detects the tracer and creates pictures of the areas that your health care provider wants to know more about. There are several kinds of nuclear medicine exams. They include the following:  CT scan.  MRI scan.  PET scan.  SPECT scan. Tell your health care provider about:  Any allergies you have.  All medicines you are taking, including vitamins, herbs, eye drops, creams, and over-the-counter medicines.  Any problems you or family members have had with anesthetic medicines.  Any blood disorders you have.  Any surgeries you have had.  Any medical conditions you have.  Whether you are pregnant or may be pregnant.  Whether you are nursing. What are the risks? Generally, this is a safe procedure. However, problems may occur, such as an allergic reaction to the tracer, but this is rare. What happens before the  procedure? Medicines Ask your health care provider about:  Changing or stopping your regular medicines. This is especially important if you are taking diabetes medicines or blood thinners.  Taking medicines such as aspirin and ibuprofen. These medicines can thin your blood. Do not take these medicines unless your health care provider tells you to take them.  Taking over-the-counter medicines, vitamins, herbs, and supplements. General instructions  Follow instructions from your health care provider about eating or drinking restrictions.  Do not wear jewelry.  Wear loose, comfortable clothing. You may be asked to wear a hospital gown for the procedure.  Bring previous imaging studies, such as X-rays, with you to the exam if they are available. What happens during the procedure?   An IV may be inserted into one of your veins.  You will be asked to lie on a table or sit in a chair.  You will be given the radioactive tracer. You may get: ? A pill or liquid to swallow. ? An injection. ? Medicine through your IV. ? A gas to inhale.  A large scanning machine will be used to create images of your body. After the pictures are taken, you may have to wait so your health care provider can make sure that enough images were taken. The procedure may vary among health care providers and hospitals. What happens after the procedure?  You may go home after the procedure and return to your usual activities, unless your health care provider tells you otherwise.  Drink enough water to keep your urine pale yellow. This helps to remove the radioactive tracer from your body.  It is up to you to get the results of your procedure. Ask your health care provider, or the department that is doing the procedure, when your results will be ready.  Get help right away if you have problems breathing. Summary  A nuclear medicine exam is a safe and painless imaging test that provides information about how your  organs are working. It is also used to detect and diagnose diseases of various body organs.  Follow your health care provider's instructions about eating and drinking restrictions. Ask whether you should change or stop any medicines.  During the procedure, you will be given a radioactive tracer. A large scanning machine will create images of your body.  You may go home after the procedure and return to your regular activities. Follow your health care provider's instructions.  Get help right away if you have problems breathing. This information is not intended to replace advice given to you by your health care provider. Make sure you discuss any questions you have with your health care provider. Document Revised: 09/30/2018 Document Reviewed: 09/30/2018 Elsevier Patient Education  [place].

## 2020-05-23 ENCOUNTER — Telehealth (HOSPITAL_COMMUNITY): Payer: Self-pay | Admitting: *Deleted

## 2020-05-23 NOTE — Telephone Encounter (Signed)
Patient given detailed instructions per Myocardial Perfusion Study Information Sheet for the test on 05/31/20 at 8:00. Patient notified to arrive 15 minutes early and that it is imperative to arrive on time for appointment to keep from having the test rescheduled.  If you need to cancel or reschedule your appointment, please call the office within 24 hours of your appointment. . Patient verbalized understanding.Randy Chase

## 2020-05-31 ENCOUNTER — Other Ambulatory Visit: Payer: Self-pay

## 2020-05-31 ENCOUNTER — Ambulatory Visit (INDEPENDENT_AMBULATORY_CARE_PROVIDER_SITE_OTHER): Payer: PPO

## 2020-05-31 DIAGNOSIS — I480 Paroxysmal atrial fibrillation: Secondary | ICD-10-CM

## 2020-05-31 DIAGNOSIS — I10 Essential (primary) hypertension: Secondary | ICD-10-CM

## 2020-05-31 DIAGNOSIS — I2510 Atherosclerotic heart disease of native coronary artery without angina pectoris: Secondary | ICD-10-CM | POA: Diagnosis not present

## 2020-05-31 LAB — MYOCARDIAL PERFUSION IMAGING
LV dias vol: 113 mL (ref 62–150)
LV sys vol: 37 mL
Peak HR: 96 {beats}/min
Rest HR: 69 {beats}/min
SDS: 2
SRS: 4
SSS: 6
TID: 1.07

## 2020-05-31 MED ORDER — AMINOPHYLLINE 25 MG/ML IV SOLN
75.0000 mg | Freq: Once | INTRAVENOUS | Status: AC
Start: 2020-05-31 — End: 2020-05-31
  Administered 2020-05-31: 75 mg via INTRAVENOUS

## 2020-05-31 MED ORDER — TECHNETIUM TC 99M TETROFOSMIN IV KIT
10.2000 | PACK | Freq: Once | INTRAVENOUS | Status: AC | PRN
Start: 2020-05-31 — End: 2020-05-31
  Administered 2020-05-31: 10.2 via INTRAVENOUS

## 2020-05-31 MED ORDER — TECHNETIUM TC 99M TETROFOSMIN IV KIT
31.6000 | PACK | Freq: Once | INTRAVENOUS | Status: AC | PRN
  Administered 2020-05-31: 31.6 via INTRAVENOUS

## 2020-05-31 MED ORDER — REGADENOSON 0.4 MG/5ML IV SOLN
0.4000 mg | Freq: Once | INTRAVENOUS | Status: AC
Start: 2020-05-31 — End: 2020-05-31
  Administered 2020-05-31: 0.4 mg via INTRAVENOUS

## 2020-06-20 ENCOUNTER — Ambulatory Visit (INDEPENDENT_AMBULATORY_CARE_PROVIDER_SITE_OTHER): Payer: PPO | Admitting: Cardiology

## 2020-06-20 ENCOUNTER — Other Ambulatory Visit: Payer: Self-pay

## 2020-06-20 ENCOUNTER — Encounter: Payer: Self-pay | Admitting: Cardiology

## 2020-06-20 VITALS — BP 138/64 | HR 72 | Ht 69.0 in | Wt 181.8 lb

## 2020-06-20 DIAGNOSIS — I10 Essential (primary) hypertension: Secondary | ICD-10-CM | POA: Diagnosis not present

## 2020-06-20 DIAGNOSIS — I2510 Atherosclerotic heart disease of native coronary artery without angina pectoris: Secondary | ICD-10-CM | POA: Diagnosis not present

## 2020-06-20 DIAGNOSIS — Z87891 Personal history of nicotine dependence: Secondary | ICD-10-CM | POA: Insufficient documentation

## 2020-06-20 DIAGNOSIS — I480 Paroxysmal atrial fibrillation: Secondary | ICD-10-CM | POA: Diagnosis not present

## 2020-06-20 DIAGNOSIS — R931 Abnormal findings on diagnostic imaging of heart and coronary circulation: Secondary | ICD-10-CM | POA: Insufficient documentation

## 2020-06-20 HISTORY — DX: Personal history of nicotine dependence: Z87.891

## 2020-06-20 HISTORY — DX: Abnormal findings on diagnostic imaging of heart and coronary circulation: R93.1

## 2020-06-20 NOTE — Progress Notes (Signed)
Cardiology Office Note:    Date:  06/20/2020   ID:  Randy Chase, DOB [birth year], MRN [id number]  PCP:  Algis Greenhouse, MD  Cardiologist:  Jenean Lindau, MD   Referring MD: Algis Greenhouse, MD    ASSESSMENT:    1. Atherosclerosis of native coronary artery of native heart without angina pectoris   2. Paroxysmal atrial fibrillation (HCC)   3. Essential hypertension   4. Abnormal nuclear cardiac imaging test   5. Ex-smoker    PLAN:    In order of problems listed above:  1. Abnormal nuclear stress test: Patient has multiple risk factors for coronary artery disease and leads a sedentary lifestyle.  He has history of smoking which he quit 2 years ago.  In view of this I recommended coronary CT angiography with FFR.  Invasive approach was also recommended but he prefers the former.  I respect his wishes.  He is not on aspirin because of rectal bleeding.  He needs this evaluation before he goes for significant elective surgery.  This was explained to him.  Benefits and risks explained. 2. Essential hypertension: Blood pressure is stable and diet was emphasized 3. Mixed dyslipidemia: On statin therapy and managed by his primary care physician. 4. Paroxysmal atrial fibrillation: Not on anticoagulation because of rectal bleeding.  He wants this to be considered after his surgery is done.  I explained risks and he understands.  Questions answered to his satisfaction. 5. Patient will be seen in follow-up appointment in 3 months or earlier if the patient has any concerns    Medication Adjustments/Labs and Tests Ordered: Current medicines are reviewed at length with the patient today.  Concerns regarding medicines are outlined above.  No orders of the defined types were placed in this encounter.  No orders of the defined types were placed in this encounter.    No chief complaint on file.    History of Present Illness:    Randy Chase is a 69 y.o. male.  Patient has past medical  history of hypertension and is a ex-smoker.  He has dyslipidemia on statin therapy.  Patient also has history of paroxysmal atrial fibrillation.  He is not on anticoagulation because of history of rectal bleeding in the past.  He is planning to undergo surgery.  At the time of my evaluation, the patient is alert awake oriented and in no distress.  His stress test was abnormal and therefore he is here for follow-up.  This was done for preop assessment.  Patient mentions to me that he walks his dog regularly.  He ambulates with a cane.  His ambulation and walking with the dog is more of stop and go.  Past Medical History:  Diagnosis Date  . Allergic rhinitis   . Anxiety   . Arthritis   . Bladder cancer ([place])   . BMI 25.0-25.9,adult   . Colitis   . Colonic polyp   . Hematuria   . Hernia, inguinal, right   . Low back pain   . Lumbar disc narrowing   . Tobacco use     Past Surgical History:  Procedure Laterality Date  . bladder cancer    . HERNIA REPAIR     Right inguinal  . SPLENIC ARTERY EMBOLIZATION     Splenic resection    Current Medications: Current Meds  Medication Sig  . albuterol (PROAIR HFA) 108 (90 Base) MCG/ACT inhaler Inhale 2 puffs into the lungs every 6 (six) hours as needed  for wheezing or shortness of breath.  Marland Kitchen atorvastatin (LIPITOR) 40 MG tablet Take by mouth.  . diltiazem (CARDIZEM) 60 MG tablet Take 60 mg by mouth 3 (three) times daily.  Marland Kitchen escitalopram (LEXAPRO) 10 MG tablet Take 10 mg by mouth daily.  Marland Kitchen escitalopram (LEXAPRO) 20 MG tablet Take by mouth.  . fluticasone (FLONASE) 50 MCG/ACT nasal spray Place daily into both nostrils.  . Fluticasone-Umeclidin-Vilant 200-62.5-25 MCG/INH AEPB Inhale into the lungs.  Marland Kitchen loratadine (CLARITIN) 10 MG tablet Take 10 mg by mouth daily.  . methadone (DOLOPHINE) 10 MG tablet Take 2 tablets in the morning and 3 tablets in the evening  . montelukast (SINGULAIR) 10 MG tablet Take 10 mg by mouth daily.  Marland Kitchen MOVANTIK 25 MG TABS  tablet Take 25 mg by mouth daily.  . naloxone (NARCAN) nasal spray 4 mg/0.1 mL Use as directed  . oxyCODONE (ROXICODONE) 15 MG immediate release tablet Take 1 tablet (15 mg total) by mouth every 6 (six) hours as needed. Must last 28 days  . SPIRIVA RESPIMAT 2.5 MCG/ACT AERS SMARTSIG:2 Puff(s) Via Inhaler Daily  . valsartan (DIOVAN) 160 MG tablet Take by mouth.  . verapamil (CALAN-SR) 240 MG CR tablet TAKE 1 TABLET BY MOUTH EVERY MORNING FORHIGH BLOOD PRESSURE, HEART AND HEART BEATS.     Allergies:   Reglan [metoclopramide]   Social History   Socioeconomic History  . Marital status: Widowed    Spouse name: Not on file  . Number of children: 1  . Years of education: 44  . Highest education level: Not on file  Occupational History  . Not on file  Tobacco Use  . Smoking status: Former Smoker    Packs/day: 0.25    Types: Cigarettes    Quit date: 05/18/2018    Years since quitting: 2.0  . Smokeless tobacco: Never Used  Vaping Use  . Vaping Use: Never used  Substance and Sexual Activity  . Alcohol use: Yes    Alcohol/week: 28.0 standard drinks    Types: 20 Cans of beer, 8 Standard drinks or equivalent per week    Comment: drinks 12 packs a week  . Drug use: No  . Sexual activity: Not on file  Other Topics Concern  . Not on file  Social History Narrative   Patient is right handed.    Patient does not drink caffeine.   Social Determinants of Health   Financial Resource Strain:   . Difficulty of Paying Living Expenses:   Food Insecurity:   . Worried About Charity fundraiser in the Last Year:   . Arboriculturist in the Last Year:   Transportation Needs:   . Film/video editor (Medical):   Marland Kitchen Lack of Transportation (Non-Medical):   Physical Activity:   . Days of Exercise per Week:   . Minutes of Exercise per Session:   Stress:   . Feeling of Stress :   Social Connections:   . Frequency of Communication with Friends and Family:   . Frequency of Social Gatherings with  Friends and Family:   . Attends Religious Services:   . Active Member of Clubs or Organizations:   . Attends Archivist Meetings:   Marland Kitchen Marital Status:      Family History: The patient's family history includes Arthritis in his father and mother; Asthma in his father; Colon cancer in his mother; Stomach cancer in his paternal grandfather.  ROS:   Please see the history of present illness.  All other systems reviewed and are negative.  EKGs/Labs/Other Studies Reviewed:    The following studies were reviewed today: Study Highlights   The left ventricular ejection fraction is hyperdynamic (>65%).  Nuclear stress EF: 68%.  There was no ST segment deviation noted during stress.  No T wave inversion was noted during stress.  Defect 1: There is a small defect of mild severity present in the mid anteroseptal location.  Findings consistent with ischemia.  This is a low risk study.    Recent Labs: No results found for requested labs within last 8760 hours.  Recent Lipid Panel No results found for: CHOL, TRIG, HDL, CHOLHDL, VLDL, LDLCALC, LDLDIRECT  Physical Exam:    VS:  BP (!) 138/64   Pulse 72   Ht 5\' 9"  (1.753 m)   Wt 181 lb 12.8 oz (82.5 kg)   SpO2 93%   BMI 26.85 kg/m     Wt Readings from Last 3 Encounters:  06/20/20 181 lb 12.8 oz (82.5 kg)  05/31/20 177 lb (80.3 kg)  05/18/20 177 lb (80.3 kg)     GEN: Patient is in no acute distress HEENT: Normal NECK: No JVD; No carotid bruits LYMPHATICS: No lymphadenopathy CARDIAC: Hear sounds regular, 2/6 systolic murmur at the apex. RESPIRATORY:  Clear to auscultation without rales, wheezing or rhonchi  ABDOMEN: Soft, non-tender, non-distended MUSCULOSKELETAL:  No edema; No deformity  SKIN: Warm and dry NEUROLOGIC:  Alert and oriented x 3 PSYCHIATRIC:  Normal affect   Signed, Jenean Lindau, MD  06/20/2020 3:36 PM    [facility]

## 2020-06-20 NOTE — Patient Instructions (Addendum)
Medication Instructions:  Your physician recommends that you continue on your current medications as directed. Please refer to the Current Medication list given to you today.  *If you need a refill on your cardiac medications before your next appointment, please call your pharmacy*   Lab Work: None ordered   If you have labs (blood work) drawn today and your tests are completely normal, you will receive your results only by: Marland Kitchen MyChart Message (if you have MyChart) OR . A paper copy in the mail If you have any lab test that is abnormal or we need to change your treatment, we will call you to review the results.   Testing/Procedures: Your physician has ordered for you to have a cardiac cta *instructions below*   Follow-Up: At [facility], you and your health needs are our priority.  As part of our continuing mission to provide you with exceptional heart care, we have created designated Provider Care Teams.  These Care Teams include your primary Cardiologist (physician) and Advanced Practice Providers (APPs -  Physician Assistants and Nurse Practitioners) who all work together to provide you with the care you need, when you need it.  We recommend signing up for the patient portal called "MyChart".  Sign up information is provided on this After Visit Summary.  MyChart is used to connect with patients for Virtual Visits (Telemedicine).  Patients are able to view lab/test results, encounter notes, upcoming appointments, etc.  Non-urgent messages can be sent to your provider as well.   To learn more about what you can do with MyChart, go to [email or web address].    Your next appointment:   4 month(s)  The format for your next appointment:   In Person  Provider:   [name], MD   Other Instructions Your cardiac CT will be scheduled at one of the below locations:   [facility] [address] [place], [place] [address] [phone]  If scheduled at [facility], please arrive at the [facility] main entrance of [facility] 30 minutes prior to test start time. Proceed to the [facility] Radiology Department (first floor) to check-in and test prep.  Please follow these instructions carefully (unless otherwise directed):  Hold all erectile dysfunction medications at least 3 days (72 hrs) prior to test.  On the Night Before the Test: . Be sure to Drink plenty of water. . Do not consume any caffeinated/decaffeinated beverages or chocolate 12 hours prior to your test. . Do not take any antihistamines 12 hours prior to your test.  On the Day of the Test: . Drink plenty of water. Do not drink any water within one hour of the test. . Do not eat any food 4 hours prior to the test. . You may take your regular medications prior to the test.  . Take Diltiazem two hours prior to test.      After the Test: . Drink plenty of water. . After receiving IV contrast, you may experience a mild flushed feeling. This is normal. . On occasion, you may experience a mild rash up to 24 hours after the test. This is not dangerous. If this occurs, you can take Benadryl 25 mg and increase your fluid intake. . If you experience trouble breathing, this can be serious. If it is severe call 911 IMMEDIATELY. If it is mild, please call our office.   Once we have confirmed authorization from your insurance company, we will call you to set up a date  and time for your test. Based on how quickly your insurance processes prior authorizations requests, please allow up to 4 weeks to be contacted for scheduling your Cardiac CT appointment. Be advised that routine Cardiac CT appointments could be scheduled as many as 8 weeks after your provider has ordered it.  For non-scheduling related questions, please contact the cardiac imaging nurse navigator should you have any questions/concerns: Marchia Bond, Cardiac Imaging Nurse Navigator Burley Saver, Interim Cardiac Imaging Nurse  [place] [facility] Direct Office Dial: [phone]   For scheduling needs, including cancellations and rescheduling, please call Vivien Rota at [phone], option 3.

## 2020-06-23 DIAGNOSIS — Z79899 Other long term (current) drug therapy: Secondary | ICD-10-CM | POA: Diagnosis not present

## 2020-06-23 DIAGNOSIS — G894 Chronic pain syndrome: Secondary | ICD-10-CM | POA: Diagnosis not present

## 2020-06-23 DIAGNOSIS — M542 Cervicalgia: Secondary | ICD-10-CM | POA: Diagnosis not present

## 2020-06-23 DIAGNOSIS — G8929 Other chronic pain: Secondary | ICD-10-CM | POA: Diagnosis not present

## 2020-06-23 DIAGNOSIS — M5137 Other intervertebral disc degeneration, lumbosacral region: Secondary | ICD-10-CM | POA: Diagnosis not present

## 2020-07-03 ENCOUNTER — Telehealth: Payer: Self-pay | Admitting: Cardiology

## 2020-07-03 ENCOUNTER — Telehealth (HOSPITAL_COMMUNITY): Payer: Self-pay | Admitting: *Deleted

## 2020-07-03 DIAGNOSIS — I10 Essential (primary) hypertension: Secondary | ICD-10-CM

## 2020-07-03 DIAGNOSIS — I480 Paroxysmal atrial fibrillation: Secondary | ICD-10-CM

## 2020-07-03 NOTE — Telephone Encounter (Signed)
Called  pt to inform him that he needs labs done today or in the AM for his CT scheduled on Wednesday.  Pt verbalized understanding and had no additional questions.

## 2020-07-03 NOTE — Telephone Encounter (Signed)
     Meryl RN cardiac navigator. She said Pt is scheduled for CT and needs BMET before CT. She said pt needs it done today or tomorrow

## 2020-07-03 NOTE — Telephone Encounter (Signed)
Attempted to call patient regarding upcoming cardiac CT appointment. Left message on voicemail with name and callback number  Samiya Mervin Tai RN Navigator Cardiac Imaging [facility] [phone] Office [phone] Cell  

## 2020-07-04 DIAGNOSIS — I10 Essential (primary) hypertension: Secondary | ICD-10-CM | POA: Diagnosis not present

## 2020-07-04 DIAGNOSIS — I480 Paroxysmal atrial fibrillation: Secondary | ICD-10-CM | POA: Diagnosis not present

## 2020-07-05 ENCOUNTER — Ambulatory Visit (HOSPITAL_COMMUNITY): Admission: RE | Admit: 2020-07-05 | Payer: PPO | Source: Ambulatory Visit

## 2020-07-05 LAB — BASIC METABOLIC PANEL
BUN/Creatinine Ratio: 15 (ref 10–24)
BUN: 12 mg/dL (ref 8–27)
CO2: 24 mmol/L (ref 20–29)
Calcium: 9.5 mg/dL (ref 8.6–10.2)
Chloride: 100 mmol/L (ref 96–106)
Creatinine, Ser: 0.82 mg/dL (ref 0.76–1.27)
GFR calc Af Amer: 105 mL/min/{1.73_m2} (ref >59)
GFR calc non Af Amer: 91 mL/min/{1.73_m2} (ref >59)
Glucose: 97 mg/dL (ref 65–99)
Potassium: 5.2 mmol/L (ref 3.5–5.2)
Sodium: 141 mmol/L (ref 134–144)

## 2020-07-10 ENCOUNTER — Telehealth (HOSPITAL_COMMUNITY): Payer: Self-pay | Admitting: Emergency Medicine

## 2020-07-10 NOTE — Telephone Encounter (Signed)
Attempted to call patient regarding upcoming cardiac CT appointment. °Left message on voicemail with name and callback number °Paytin Ramakrishnan RN Navigator Cardiac Imaging °[facility] °[phone] Office °[phone] Cell ° °

## 2020-07-10 NOTE — Telephone Encounter (Signed)
Pt returning phone call regarding upcoming cardiac imaging study; pt verbalizes understanding of appt date/time, parking situation and where to check in, pre-test NPO status and medications ordered, and verified current allergies; name and call back number provided for further questions should they arise Deantae Shackleton RN Navigator Cardiac Imaging [facility] [phone] office [phone] cell   

## 2020-07-12 ENCOUNTER — Ambulatory Visit (HOSPITAL_COMMUNITY)
Admission: RE | Admit: 2020-07-12 | Discharge: 2020-07-12 | Disposition: A | Discharge status: Home / Self Care | Payer: PPO | Source: Ambulatory Visit | Attending: Cardiology | Admitting: Cardiology

## 2020-07-12 DIAGNOSIS — I2510 Atherosclerotic heart disease of native coronary artery without angina pectoris: Secondary | ICD-10-CM | POA: Diagnosis not present

## 2020-07-12 DIAGNOSIS — R931 Abnormal findings on diagnostic imaging of heart and coronary circulation: Secondary | ICD-10-CM | POA: Diagnosis not present

## 2020-07-12 DIAGNOSIS — I700 Atherosclerosis of aorta: Secondary | ICD-10-CM | POA: Insufficient documentation

## 2020-07-12 IMAGING — CT CT HEART MORP W/ CTA COR W/ SCORE W/ CA W/CM &/OR W/O CM
4 of 7 series · 8 of 20 positions shown, 9 images · IV contrast (APPLIED)
Comparison: Chest CT 09/01/2017.
COMPARISON: Chest CT 09/01/2017.

Addendum:
EXAM:
OVER-READ INTERPRETATION  CT CHEST

The following report is an over-read performed by radiologist Dr.
Clover Ohayon [REDACTED] on 07/12/2020. This
over-read does not include interpretation of cardiac or coronary
anatomy or pathology. The coronary calcium score interpretation by
the cardiologist is attached.
CLINICAL DATA: This is a 69 male with abnormal nuclear stress test.
Medical history includes hypertension, Paroxysmal atrial
fibrillation and dyslipidemia.
Cardiac/Coronary  CT
TECHNIQUE: The patient was scanned on a Phillips Force scanner.

[Series 6: best diast 76 % · axial · 0.39mm/px · z∈[-135,-100]mm · 2 of 261 slices shown]
[im 87/261  vessel]
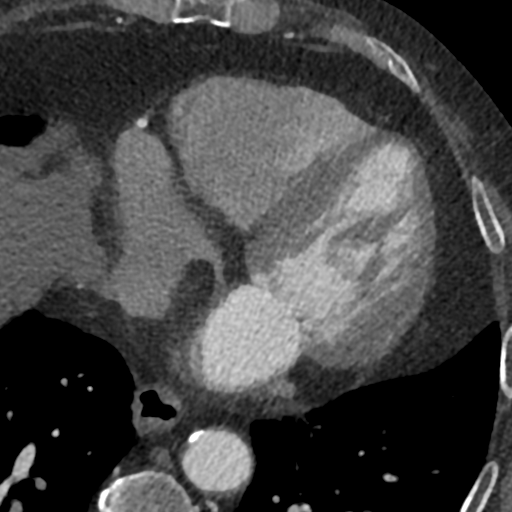
[im 174/261  vessel]
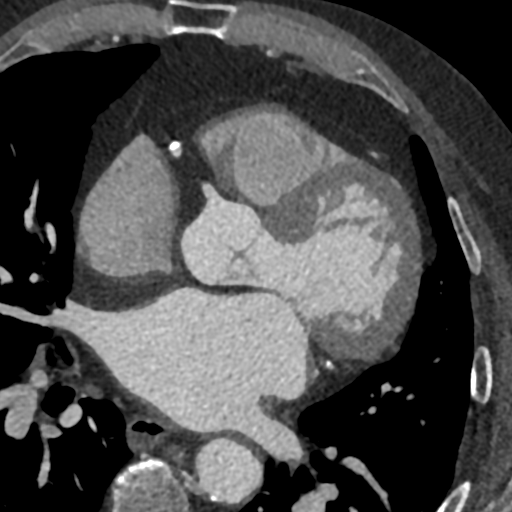

[Series 7: best syst · axial · 0.39mm/px · z∈[-135,-100]mm · 2 of 261 slices shown, 3 images]
[im 87/261  vessel]
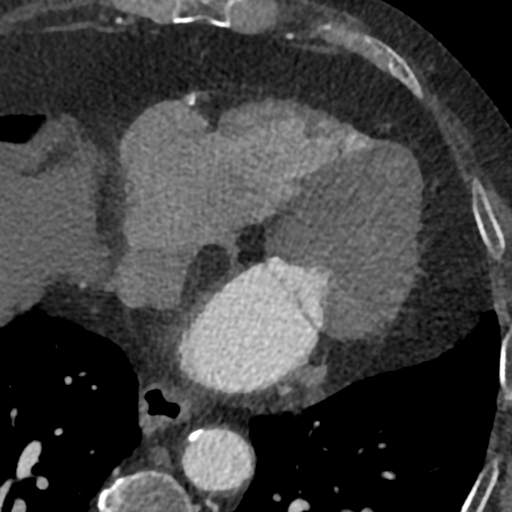
[im 87/261  lung]
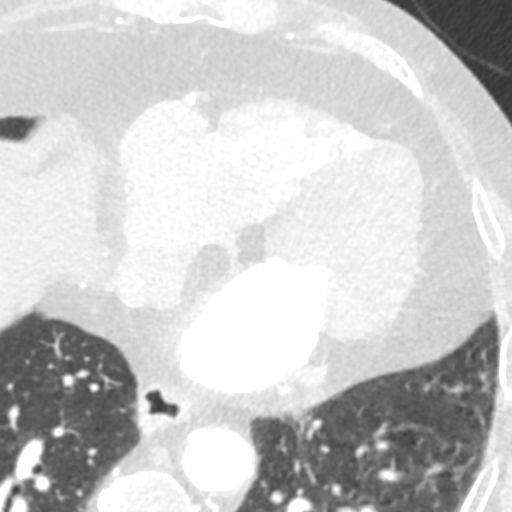
[im 174/261  vessel]
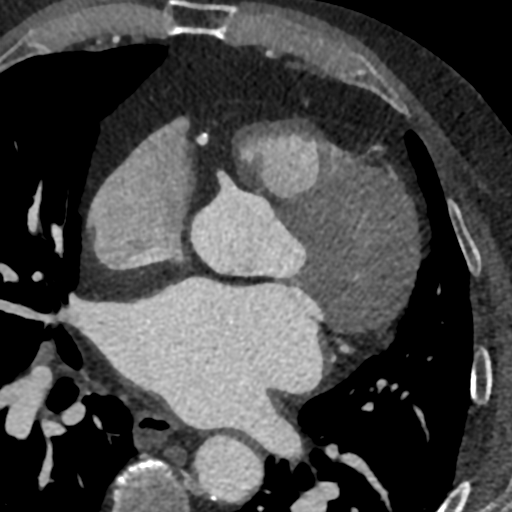

[Series 9: ts diast sharp 76 % · axial · 0.39mm/px · z∈[-135,-100]mm · 2 of 261 slices shown]
[im 87/261  lung]
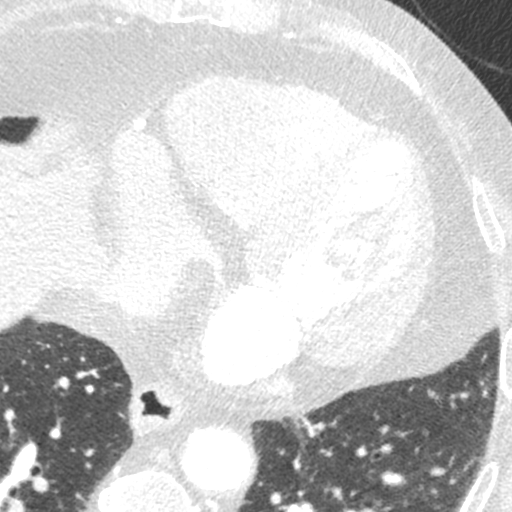
[im 174/261  lung]
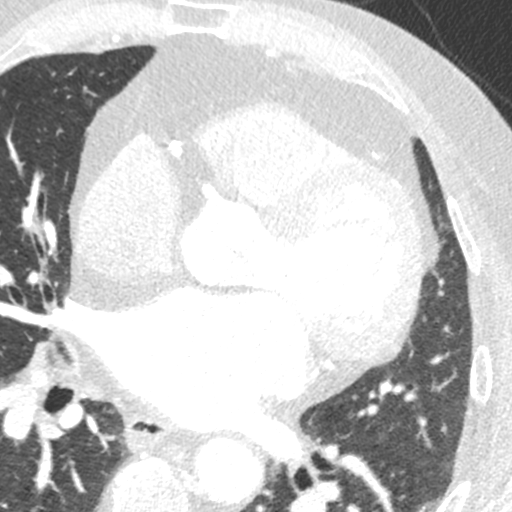

[Series 10: ts syst sharp · axial · 0.39mm/px · z∈[-135,-100]mm · 2 of 261 slices shown]
[im 87/261  lung]
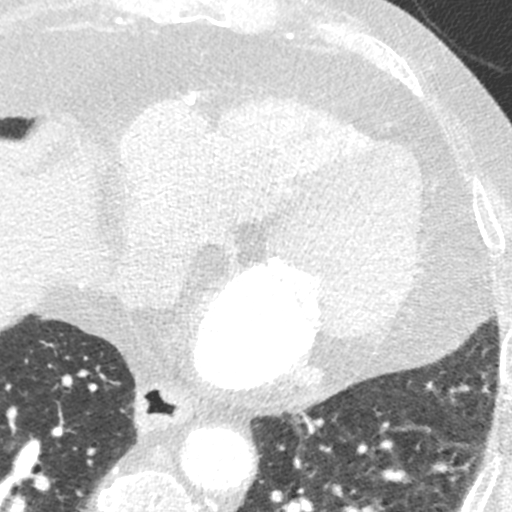
[im 174/261  lung]
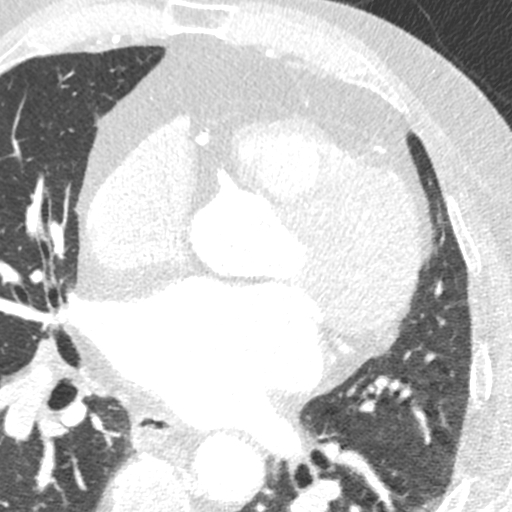

[8 of 20 positions shown; findings below may reference images not displayed]

FINDINGS: Aortic atherosclerosis. Within the visualized portions of the thorax
there are no suspicious appearing pulmonary nodules or masses, there
is no acute consolidative airspace disease, no pleural effusions, no
pneumothorax and no lymphadenopathy. Visualized portions of the
upper abdomen demonstrates a large incompletely imaged
low-attenuation lesion in the right lobe of the liver which measures
at least 7.5 cm in diameter, similar to prior chest CT 09/01/2017,
compatible with a large hepatic cyst. Multiple small splenules are
also noted in the upper abdomen and there appears to have been prior
splenectomy. There are no aggressive appearing lytic or blastic
lesions noted in the visualized portions of the skeleton.
IMPRESSION: 1.  Aortic Atherosclerosis (YDOCT-I9D.D).
FINDINGS: A 120 kV prospective scan was triggered in the descending thoracic
aorta at 111 HU's. Axial non-contrast 3 mm slices were carried out
through the heart. The data set was analyzed on a dedicated work
station and scored using the Agatson method. Gantry rotation speed
was 250 msecs and collimation was .6 mm. No beta blockade and 0.8 mg
of sl NTG was given. The 3D data set was reconstructed in 5%
intervals of the 67-82 % of the R-R cycle. Diastolic phases were
analyzed on a dedicated work station using MPR, MIP and VRT modes.
The patient received 80 cc of contrast.

Aorta: Normal size.  Mild calcifications.  No dissection.

Aortic Valve:  Trileaflet.  No calcifications.

Coronary Arteries:  Normal coronary origin.  Right dominance.

RCA is a large dominant artery that gives rise to PDA and PLVB.
There is mild (25-49%) diffuse calcified plaques in proximal to mid
portion of the vessel. The distal portion with a moderate (50-69%)
mixed plaque.

Left main is a large artery that gives rise to LAD and LCX arteries.
There is no plaque.

LAD is a large vessel. There are two separate plaques in the
proximal of the LAD: The first plaque is a calcified mild (25-49%),
the second plaques in mixed mild (25-49%). D1 with a moderate
(50-69%) calcified lesion at the ostium.

LCX is a non-dominant artery that gives rise to one large OM1
branch. There is a mild (25-49%) calcified plaque in the proximal
LCX artery. The mid and distal portions of the vessel with no
plaque.

Other findings:

Normal pulmonary vein drainage into the left atrium.

Normal left atrial appendage without a thrombus.

Normal size of the pulmonary artery.
IMPRESSION: 1. Coronary calcium score of 631. This was 80 percentile for age and
sex matched control.

2. Normal coronary origin with right dominance.

3. Moderate Coronary artery disease. CADRADS 3. This study will be
sent for FFRct.

4. Mild aortic atherosclerosis.

Lescu Otean, DO

*** End of Addendum ***
EXAM:
OVER-READ INTERPRETATION  CT CHEST

The following report is an over-read performed by radiologist Dr.
Clover Ohayon [REDACTED] on 07/12/2020. This
over-read does not include interpretation of cardiac or coronary
anatomy or pathology. The coronary calcium score interpretation by
the cardiologist is attached.
FINDINGS: Aortic atherosclerosis. Within the visualized portions of the thorax
there are no suspicious appearing pulmonary nodules or masses, there
is no acute consolidative airspace disease, no pleural effusions, no
pneumothorax and no lymphadenopathy. Visualized portions of the
upper abdomen demonstrates a large incompletely imaged
low-attenuation lesion in the right lobe of the liver which measures
at least 7.5 cm in diameter, similar to prior chest CT 09/01/2017,
compatible with a large hepatic cyst. Multiple small splenules are
also noted in the upper abdomen and there appears to have been prior
splenectomy. There are no aggressive appearing lytic or blastic
lesions noted in the visualized portions of the skeleton.
IMPRESSION: 1.  Aortic Atherosclerosis (YDOCT-I9D.D).

## 2020-07-12 MED ORDER — DILTIAZEM HCL 25 MG/5ML IV SOLN
10.0000 mg | Freq: Once | INTRAVENOUS | Status: AC
  Administered 2020-07-12: 10 mg via INTRAVENOUS
  Filled 2020-07-12: qty 5

## 2020-07-12 MED ORDER — IOHEXOL 350 MG/ML SOLN
80.0000 mL | Freq: Once | INTRAVENOUS | Status: AC | PRN
  Administered 2020-07-12: 80 mL via INTRAVENOUS

## 2020-07-12 MED ORDER — NITROGLYCERIN 0.4 MG SL SUBL
SUBLINGUAL_TABLET | SUBLINGUAL | Status: AC
  Filled 2020-07-12: qty 2

## 2020-07-12 MED ORDER — DILTIAZEM HCL 25 MG/5ML IV SOLN
INTRAVENOUS | Status: AC
  Administered 2020-07-12: 5 mg
  Filled 2020-07-12: qty 5

## 2020-07-12 MED ORDER — NITROGLYCERIN 0.4 MG SL SUBL
0.8000 mg | SUBLINGUAL_TABLET | Freq: Once | SUBLINGUAL | Status: AC
  Administered 2020-07-12: 0.8 mg via SUBLINGUAL

## 2020-07-14 ENCOUNTER — Ambulatory Visit (HOSPITAL_COMMUNITY)
Admission: RE | Admit: 2020-07-14 | Discharge: 2020-07-14 | Disposition: A | Discharge status: Home / Self Care | Payer: PPO | Source: Ambulatory Visit | Attending: Cardiology | Admitting: Cardiology

## 2020-07-14 DIAGNOSIS — R931 Abnormal findings on diagnostic imaging of heart and coronary circulation: Secondary | ICD-10-CM | POA: Diagnosis not present

## 2020-07-16 DIAGNOSIS — I700 Atherosclerosis of aorta: Secondary | ICD-10-CM | POA: Diagnosis not present

## 2020-07-18 ENCOUNTER — Telehealth: Payer: Self-pay | Admitting: Cardiology

## 2020-07-18 DIAGNOSIS — I2510 Atherosclerotic heart disease of native coronary artery without angina pectoris: Secondary | ICD-10-CM

## 2020-07-18 MED ORDER — ASPIRIN EC 81 MG PO TBEC: 81.0000 mg | DELAYED_RELEASE_TABLET | Freq: Every day | ORAL | 3 refills | Status: AC

## 2020-07-18 MED ORDER — NITROGLYCERIN 0.4 MG SL SUBL
0.4000 mg | SUBLINGUAL_TABLET | SUBLINGUAL | 6 refills | Status: DC | PRN
Start: 2020-07-18 — End: 2023-03-11

## 2020-07-18 NOTE — Telephone Encounter (Signed)
Patient is calling for CT results.

## 2020-07-18 NOTE — Telephone Encounter (Signed)
Results reviewed with pt as per Dr. Julien Nordmann note.  Pt verbalized understanding and had no additional questions. Routed to PCP. Pt aware to start aspirin 81 mg daily and take nitroglycerin as needed for chest pain.

## 2020-07-21 DIAGNOSIS — G8929 Other chronic pain: Secondary | ICD-10-CM | POA: Diagnosis not present

## 2020-07-21 DIAGNOSIS — M542 Cervicalgia: Secondary | ICD-10-CM | POA: Diagnosis not present

## 2020-07-21 DIAGNOSIS — G894 Chronic pain syndrome: Secondary | ICD-10-CM | POA: Diagnosis not present

## 2020-07-21 DIAGNOSIS — Z79899 Other long term (current) drug therapy: Secondary | ICD-10-CM | POA: Diagnosis not present

## 2020-07-21 DIAGNOSIS — M5137 Other intervertebral disc degeneration, lumbosacral region: Secondary | ICD-10-CM | POA: Diagnosis not present

## 2020-07-28 ENCOUNTER — Ambulatory Visit (INDEPENDENT_AMBULATORY_CARE_PROVIDER_SITE_OTHER): Payer: PPO | Admitting: Cardiology

## 2020-07-28 ENCOUNTER — Encounter: Payer: Self-pay | Admitting: Cardiology

## 2020-07-28 ENCOUNTER — Other Ambulatory Visit: Payer: Self-pay

## 2020-07-28 VITALS — BP 136/70 | HR 70 | Ht 69.0 in | Wt 193.0 lb

## 2020-07-28 DIAGNOSIS — I2510 Atherosclerotic heart disease of native coronary artery without angina pectoris: Secondary | ICD-10-CM

## 2020-07-28 DIAGNOSIS — I480 Paroxysmal atrial fibrillation: Secondary | ICD-10-CM | POA: Diagnosis not present

## 2020-07-28 DIAGNOSIS — R931 Abnormal findings on diagnostic imaging of heart and coronary circulation: Secondary | ICD-10-CM

## 2020-07-28 DIAGNOSIS — E782 Mixed hyperlipidemia: Secondary | ICD-10-CM

## 2020-07-28 HISTORY — DX: Mixed hyperlipidemia: E78.2

## 2020-07-28 HISTORY — DX: Atherosclerotic heart disease of native coronary artery without angina pectoris: I25.10

## 2020-07-28 NOTE — Patient Instructions (Signed)
Medication Instructions:   Your physician recommends that you continue on your current medications as directed. Please refer to the Current Medication list given to you today.  *If you need a refill on your cardiac medications before your next appointment, please call your pharmacy*   Lab Work: [place]   If you have labs (blood work) drawn today and your tests are completely normal, you will receive your results only by: Marland Kitchen MyChart Message (if you have MyChart) OR . A paper copy in the mail If you have any lab test that is abnormal or we need to change your treatment, we will call you to review the results.   Testing/Procedures: NONE ORDERED  TODAY     Follow-Up: At [facility], you and your health needs are our priority.  As part of our continuing mission to provide you with exceptional heart care, we have created designated Provider Care Teams.  These Care Teams include your primary Cardiologist (physician) and Advanced Practice Providers (APPs -  Physician Assistants and Nurse Practitioners) who all work together to provide you with the care you need, when you need it.  We recommend signing up for the patient portal called "MyChart".  Sign up information is provided on this After Visit Summary.  MyChart is used to connect with patients for Virtual Visits (Telemedicine).  Patients are able to view lab/test results, encounter notes, upcoming appointments, etc.  Non-urgent messages can be sent to your provider as well.   To learn more about what you can do with MyChart, go to [email or web address].    Your next appointment:   4 month(s)  The format for your next appointment:   In Person  Provider:   You will see Jenean Lindau, MD.  Or, you can be scheduled with the following Advanced Practice Provider on your designated Care Team (at our [facility]):  Laurann Montana, FNP     Other Instructions

## 2020-07-28 NOTE — Progress Notes (Signed)
Cardiology Office Note:    Date:  07/28/2020   ID:  Delorise Shiner, DOB 02-11-1951, MRN [id number]  PCP:  Algis Greenhouse, MD  Cardiologist:  Jenean Lindau, MD   Referring MD: Algis Greenhouse, MD    ASSESSMENT:    1. Paroxysmal atrial fibrillation (HCC)   2. Coronary artery disease involving native coronary artery of native heart without angina pectoris   3. Abnormal nuclear cardiac imaging test   4. Mixed dyslipidemia    PLAN:    In order of problems listed above:  1. Coronary artery disease: Secondary prevention stressed with patient.  Importance of compliance with diet medication stressed and he vocalized understanding.  I told him to walk to the best of his ability and he promises to do so.  He has abdominal obesity and weight reduction was stressed and diet was emphasized. 2. Essential hypertension: Blood pressure stable lifestyle modification was urged 3. Mixed dyslipidemia: Patient on statin therapy and will be back in the next few days for fasting blood work including lipids 4. Paroxysmal atrial fibrillation: Stable in sinus rhythm at this time.  Not on anticoagulation because of GI bleed in the past. 5. Patient will be seen in follow-up appointment in 4 months or earlier if the patient has any concerns 6. Ex-smoker: Promises to never go back to smoking.   Medication Adjustments/Labs and Tests Ordered: Current medicines are reviewed at length with the patient today.  Concerns regarding medicines are outlined above.  Orders Placed This Encounter  Procedures  . Basic Metabolic Panel (BMET)  . Hepatic function panel  . Lipid panel   No orders of the defined types were placed in this encounter.    No chief complaint on file.    History of Present Illness:    THEOREN PALKA is a 69 y.o. male.  Patient has past medical history of essential hypertension dyslipidemia paroxysmal atrial fibrillation.  His CT scanning of the chest revealed significant amount of  atherosclerotic burden but no obstructive disease according to the CT coronary angiography.  He denies any problems at this time and takes care of activities of daily living.  No chest pain orthopnea or PND.  He leads a sedentary lifestyle because of orthopedic issues.  At the time of my evaluation, the patient is alert awake oriented and in no distress.  Past Medical History:  Diagnosis Date  . Allergic rhinitis   . Anxiety   . Arthritis   . Bladder cancer ([place])   . BMI 25.0-25.9,adult   . Colitis   . Colonic polyp   . Hematuria   . Hernia, inguinal, right   . Low back pain   . Lumbar disc narrowing   . Tobacco use     Past Surgical History:  Procedure Laterality Date  . bladder cancer    . HERNIA REPAIR     Right inguinal  . SPLENIC ARTERY EMBOLIZATION     Splenic resection    Current Medications: Current Meds  Medication Sig  . albuterol (PROAIR HFA) 108 (90 Base) MCG/ACT inhaler Inhale 2 puffs into the lungs every 6 (six) hours as needed for wheezing or shortness of breath.  Marland Kitchen aspirin EC 81 MG tablet Take 1 tablet (81 mg total) by mouth daily. Swallow whole.  Marland Kitchen atorvastatin (LIPITOR) 40 MG tablet Take by mouth.  . diltiazem (CARDIZEM) 60 MG tablet Take 60 mg by mouth 3 (three) times daily.  Marland Kitchen escitalopram (LEXAPRO) 10 MG tablet Take 10 mg  by mouth daily.  Marland Kitchen escitalopram (LEXAPRO) 20 MG tablet Take by mouth.  . fluticasone (FLONASE) 50 MCG/ACT nasal spray Place daily into both nostrils.  . Fluticasone-Umeclidin-Vilant 200-62.5-25 MCG/INH AEPB Inhale into the lungs.  Marland Kitchen loratadine (CLARITIN) 10 MG tablet Take 10 mg by mouth daily.  . methadone (DOLOPHINE) 10 MG tablet Take 2 tablets in the morning and 3 tablets in the evening  . montelukast (SINGULAIR) 10 MG tablet Take 10 mg by mouth daily.  Marland Kitchen MOVANTIK 25 MG TABS tablet Take 25 mg by mouth daily.  . naloxone (NARCAN) nasal spray 4 mg/0.1 mL Use as directed  . nitroGLYCERIN (NITROSTAT) 0.4 MG SL tablet Place 1 tablet (0.4  mg total) under the tongue every 5 (five) minutes as needed.  Marland Kitchen oxyCODONE (ROXICODONE) 15 MG immediate release tablet Take 1 tablet (15 mg total) by mouth every 6 (six) hours as needed. Must last 28 days  . SPIRIVA RESPIMAT 2.5 MCG/ACT AERS SMARTSIG:2 Puff(s) Via Inhaler Daily  . valsartan (DIOVAN) 160 MG tablet Take by mouth.  . verapamil (CALAN-SR) 240 MG CR tablet TAKE 1 TABLET BY MOUTH EVERY MORNING FORHIGH BLOOD PRESSURE, HEART AND HEART BEATS.     Allergies:   Reglan [metoclopramide]   Social History   Socioeconomic History  . Marital status: Widowed    Spouse name: Not on file  . Number of children: 1  . Years of education: 27  . Highest education level: Not on file  Occupational History  . Not on file  Tobacco Use  . Smoking status: Former Smoker    Packs/day: 0.25    Types: Cigarettes    Quit date: 05/18/2018    Years since quitting: 2.1  . Smokeless tobacco: Never Used  Vaping Use  . Vaping Use: Never used  Substance and Sexual Activity  . Alcohol use: Yes    Alcohol/week: 28.0 standard drinks    Types: 20 Cans of beer, 8 Standard drinks or equivalent per week    Comment: drinks 12 packs a week  . Drug use: No  . Sexual activity: Not on file  Other Topics Concern  . Not on file  Social History Narrative   Patient is right handed.    Patient does not drink caffeine.   Social Determinants of Health   Financial Resource Strain:   . Difficulty of Paying Living Expenses: Not on file  Food Insecurity:   . Worried About Charity fundraiser in the Last Year: Not on file  . Ran Out of Food in the Last Year: Not on file  Transportation Needs:   . Lack of Transportation (Medical): Not on file  . Lack of Transportation (Non-Medical): Not on file  Physical Activity:   . Days of Exercise per Week: Not on file  . Minutes of Exercise per Session: Not on file  Stress:   . Feeling of Stress : Not on file  Social Connections:   . Frequency of Communication with Friends  and Family: Not on file  . Frequency of Social Gatherings with Friends and Family: Not on file  . Attends Religious Services: Not on file  . Active Member of Clubs or Organizations: Not on file  . Attends Archivist Meetings: Not on file  . Marital Status: Not on file     Family History: The patient's family history includes Arthritis in his father and mother; Asthma in his father; Colon cancer in his mother; Stomach cancer in his paternal grandfather.  ROS:  Please see the history of present illness.    All other systems reviewed and are negative.  EKGs/Labs/Other Studies Reviewed:    The following studies were reviewed today: IMPRESSION: 1. Coronary calcium score of 631. This was 70 percentile for age and sex matched control.  2. Normal coronary origin with right dominance.  3. Moderate Coronary artery disease. CADRADS 3. This study will be sent for FFRct.  4. Mild aortic atherosclerosis.  Kardie Tobb, DO  EXAM: FFRCT ANALYSIS  FINDINGS: FFRct analysis was performed on the original cardiac CT angiogram dataset. Diagrammatic representation of the FFRct analysis is provided in a separate PDF document in PACS. This dictation was created using the PDF document and an interactive 3D model of the results. 3D model is not available in the EMR/PACS. Normal FFR range is >0.80.  1. Left Main:0.99  2. LAD: Proximal 0.99, Mid 0.95, Distal 0.82  3. LCX: Proximal 0.95, Mid 0.0.99, Distal 0.98  4. RCA: Proximal 0.95, Mid 0.93, Distal 0.93  IMPRESSION: The FFRct as outlined above does not show any flow limiting lesion. Recommend aggressive medical therapy.  Note: These examples are not recommendations of [employer] and only provided as examples of what other customers are doing.   Electronically Signed   By: Berniece Salines DO   Recent Labs: 07/04/2020: BUN 12; Creatinine, Ser 0.82; Potassium 5.2; Sodium 141  Recent Lipid Panel No results found  for: CHOL, TRIG, HDL, CHOLHDL, VLDL, LDLCALC, LDLDIRECT  Physical Exam:    VS:  BP 136/70   Pulse 70   Ht 5\' 9"  (1.753 m)   Wt 193 lb (87.5 kg)   SpO2 95%   BMI 28.50 kg/m     Wt Readings from Last 3 Encounters:  07/28/20 193 lb (87.5 kg)  06/20/20 181 lb 12.8 oz (82.5 kg)  05/31/20 177 lb (80.3 kg)     GEN: Patient is in no acute distress HEENT: Normal NECK: No JVD; No carotid bruits LYMPHATICS: No lymphadenopathy CARDIAC: Hear sounds regular, 2/6 systolic murmur at the apex. RESPIRATORY:  Clear to auscultation without rales, wheezing or rhonchi  ABDOMEN: Soft, non-tender, non-distended MUSCULOSKELETAL:  No edema; No deformity  SKIN: Warm and dry NEUROLOGIC:  Alert and oriented x 3 PSYCHIATRIC:  Normal affect   Signed, Jenean Lindau, MD  07/28/2020 3:56 PM    [facility]

## 2020-08-08 DIAGNOSIS — F330 Major depressive disorder, recurrent, mild: Secondary | ICD-10-CM | POA: Diagnosis not present

## 2020-08-08 DIAGNOSIS — F419 Anxiety disorder, unspecified: Secondary | ICD-10-CM | POA: Diagnosis not present

## 2020-08-18 DIAGNOSIS — G8929 Other chronic pain: Secondary | ICD-10-CM | POA: Diagnosis not present

## 2020-08-18 DIAGNOSIS — Z79899 Other long term (current) drug therapy: Secondary | ICD-10-CM | POA: Diagnosis not present

## 2020-08-18 DIAGNOSIS — M542 Cervicalgia: Secondary | ICD-10-CM | POA: Diagnosis not present

## 2020-08-18 DIAGNOSIS — M5137 Other intervertebral disc degeneration, lumbosacral region: Secondary | ICD-10-CM | POA: Diagnosis not present

## 2020-08-18 DIAGNOSIS — G894 Chronic pain syndrome: Secondary | ICD-10-CM | POA: Diagnosis not present

## 2020-08-29 DIAGNOSIS — Z1211 Encounter for screening for malignant neoplasm of colon: Secondary | ICD-10-CM | POA: Diagnosis not present

## 2020-08-29 DIAGNOSIS — K432 Incisional hernia without obstruction or gangrene: Secondary | ICD-10-CM | POA: Diagnosis not present

## 2020-09-15 DIAGNOSIS — M545 Low back pain, unspecified: Secondary | ICD-10-CM | POA: Diagnosis not present

## 2020-09-15 DIAGNOSIS — I6523 Occlusion and stenosis of bilateral carotid arteries: Secondary | ICD-10-CM | POA: Diagnosis not present

## 2020-09-15 DIAGNOSIS — K432 Incisional hernia without obstruction or gangrene: Secondary | ICD-10-CM | POA: Diagnosis not present

## 2020-09-15 DIAGNOSIS — M5137 Other intervertebral disc degeneration, lumbosacral region: Secondary | ICD-10-CM | POA: Diagnosis not present

## 2020-09-15 DIAGNOSIS — Z20822 Contact with and (suspected) exposure to covid-19: Secondary | ICD-10-CM | POA: Diagnosis not present

## 2020-09-15 DIAGNOSIS — Z87891 Personal history of nicotine dependence: Secondary | ICD-10-CM | POA: Diagnosis not present

## 2020-09-15 DIAGNOSIS — Z1211 Encounter for screening for malignant neoplasm of colon: Secondary | ICD-10-CM | POA: Diagnosis not present

## 2020-09-15 DIAGNOSIS — Z01812 Encounter for preprocedural laboratory examination: Secondary | ICD-10-CM | POA: Diagnosis not present

## 2020-09-15 DIAGNOSIS — Z79899 Other long term (current) drug therapy: Secondary | ICD-10-CM | POA: Diagnosis not present

## 2020-09-20 ENCOUNTER — Telehealth: Payer: Self-pay | Admitting: Cardiology

## 2020-09-20 NOTE — Telephone Encounter (Signed)
[facility]  Called again to check on status needs clearance by today at 4:00pm.  If patient is not going to be cleared please call them.

## 2020-09-20 NOTE — Telephone Encounter (Signed)
I reviewed my notes and I think it is fair to withhold aspirin for a couple of days as felt  Okay by the doctor who does the procedure.  Subsequently he needs dyslipidemia treated.

## 2020-09-20 NOTE — Telephone Encounter (Signed)
Patient is needing cardiac evaluation for colonoscopy. They have a question about Aspirin.  PMH: CAD, HTN, HLD, PAF not on a/c due to GIB.  Last seen 07/28/20 and symptomatically stable. His CT scanning of the chest revealed significant amount of atherosclerotic burden but no obstructive disease according to the CT coronary angiography. He takes Aspirin daily. Given history of PAF and CAD are there any specific recommendations for Aspirin.   Please direct response P CV DIV PREOP  Thanks Recia Sons Kathlen Mody, PA-C

## 2020-09-20 NOTE — Telephone Encounter (Signed)
   [facility] Pre-operative Risk Assessment    Request for surgical clearance:  1. What type of surgery is being performed? colonoscopy  2. When is this surgery scheduled? 09/21/2020  3. What type of clearance is required (medical clearance vs. Pharmacy clearance to hold med vs. Both)? Both   4. Are there any medications that need to be held prior to surgery and how long?  aspirin EC 81 MG tablet 5. Practice name and name of physician performing surgery?  [facility] & Dr. Noberto Retort  6. What is your office phone number [phone]   7.   What is your office fax number [phone]  8.   Anesthesia type (None, local, MAC, general) ? General    Kaitlyn Cornell 09/20/2020, 2:05 PM  _________________________________________________________________   (provider comments below)  Need the clearance by 4 PM in order to perform surgery tomorrow 09/21/2020. States they sent a fax on 08/29/2020 and sent a second request at 2 pm 10/27. Please call if unable to clear -

## 2020-09-21 DIAGNOSIS — I480 Paroxysmal atrial fibrillation: Secondary | ICD-10-CM | POA: Diagnosis not present

## 2020-09-21 DIAGNOSIS — G894 Chronic pain syndrome: Secondary | ICD-10-CM | POA: Diagnosis not present

## 2020-09-21 DIAGNOSIS — E7800 Pure hypercholesterolemia, unspecified: Secondary | ICD-10-CM | POA: Diagnosis not present

## 2020-09-21 DIAGNOSIS — Z79899 Other long term (current) drug therapy: Secondary | ICD-10-CM | POA: Diagnosis not present

## 2020-09-21 DIAGNOSIS — I4891 Unspecified atrial fibrillation: Secondary | ICD-10-CM | POA: Diagnosis not present

## 2020-09-21 DIAGNOSIS — K621 Rectal polyp: Secondary | ICD-10-CM | POA: Diagnosis not present

## 2020-09-21 DIAGNOSIS — Z1211 Encounter for screening for malignant neoplasm of colon: Secondary | ICD-10-CM | POA: Diagnosis not present

## 2020-09-21 DIAGNOSIS — Z7901 Long term (current) use of anticoagulants: Secondary | ICD-10-CM | POA: Diagnosis not present

## 2020-09-21 DIAGNOSIS — I2510 Atherosclerotic heart disease of native coronary artery without angina pectoris: Secondary | ICD-10-CM | POA: Diagnosis not present

## 2020-09-21 DIAGNOSIS — D128 Benign neoplasm of rectum: Secondary | ICD-10-CM | POA: Diagnosis not present

## 2020-09-21 DIAGNOSIS — I10 Essential (primary) hypertension: Secondary | ICD-10-CM | POA: Diagnosis not present

## 2020-09-21 DIAGNOSIS — E785 Hyperlipidemia, unspecified: Secondary | ICD-10-CM | POA: Diagnosis not present

## 2020-09-21 DIAGNOSIS — Z87891 Personal history of nicotine dependence: Secondary | ICD-10-CM | POA: Diagnosis not present

## 2020-09-21 NOTE — Telephone Encounter (Signed)
   Primary Cardiologist: Jenean Lindau, MD  Chart reviewed as part of pre-operative protocol coverage. I contacted the patient 09/21/20 and he informs me he just finished with his colonoscopy procedure.   I will remove from pre-op pool at this time.   Abigail Butts, PA-C 09/21/2020, 10:55 AM

## 2020-09-22 ENCOUNTER — Telehealth: Payer: Self-pay

## 2020-09-22 NOTE — Telephone Encounter (Signed)
   Primary Cardiologist: Jenean Lindau, MD  Chart reviewed as part of pre-operative protocol coverage. Patient was contacted 09/22/2020 in reference to pre-operative risk assessment for pending surgery as outlined below.  Randy Chase was last seen on 07/28/2020 by Dr. Geraldo Pitter.  Since that day, Randy Chase has done fine from a cardiac standpoint. He has some chronic SOB at rest but no exertional chest pain or DOE. He can complete 4 METs without anginal complaints.   Therefore, based on ACC/AHA guidelines, the patient would be at acceptable risk for the planned procedure without further cardiovascular testing.   Recent cardiac work-up revealed moderate non-obstructive CAD. He can hold aspirin up to 7 days prior to his surgery and should restart this medication when cleared to do so by his surgeon.   The patient was advised that if he develops new symptoms prior to surgery to contact our office to arrange for a follow-up visit, and he verbalized understanding.  I will route this recommendation to the requesting party via Epic fax function and remove from pre-op pool. Please call with questions.  Abigail Butts, PA-C 09/22/2020, 12:36 PM

## 2020-09-22 NOTE — Telephone Encounter (Signed)
° °  [facility] Pre-operative Risk Assessment    HEARTCARE STAFF: - Please ensure there is not already an duplicate clearance open for this procedure. - Under Visit Info/Reason for Call, type in Other and utilize the format Clearance MM/DD/YY or Clearance TBD. Do not use dashes or single digits. - If request is for dental extraction, please clarify the # of teeth to be extracted.  Request for surgical clearance:  1. What type of surgery is being performed? Incisional Hernia repair   2. When is this surgery scheduled? Within the next 2 weeks   3. What type of clearance is required (medical clearance vs. Pharmacy clearance to hold med vs. Both)? both  4. Are there any medications that need to be held prior to surgery and how long?aspirin   5. Practice name and name of physician performing surgery? [facility], Dr. Phillis Knack   6. What is the office phone number? [phone]   7.   What is the office fax number? [phone]  8.   Anesthesia type (None, local, MAC, general) ? General   Truddie Hidden 09/22/2020, 10:51 AM  _________________________________________________________________   (provider comments below)

## 2020-09-27 DIAGNOSIS — K432 Incisional hernia without obstruction or gangrene: Secondary | ICD-10-CM | POA: Diagnosis not present

## 2020-09-27 DIAGNOSIS — E7800 Pure hypercholesterolemia, unspecified: Secondary | ICD-10-CM | POA: Diagnosis not present

## 2020-09-27 DIAGNOSIS — J438 Other emphysema: Secondary | ICD-10-CM | POA: Diagnosis not present

## 2020-09-27 DIAGNOSIS — R7303 Prediabetes: Secondary | ICD-10-CM | POA: Diagnosis not present

## 2020-09-27 DIAGNOSIS — F330 Major depressive disorder, recurrent, mild: Secondary | ICD-10-CM | POA: Diagnosis not present

## 2020-09-27 DIAGNOSIS — I480 Paroxysmal atrial fibrillation: Secondary | ICD-10-CM | POA: Diagnosis not present

## 2020-09-27 DIAGNOSIS — I10 Essential (primary) hypertension: Secondary | ICD-10-CM | POA: Diagnosis not present

## 2020-09-27 DIAGNOSIS — R7989 Other specified abnormal findings of blood chemistry: Secondary | ICD-10-CM | POA: Diagnosis not present

## 2020-09-27 DIAGNOSIS — I2510 Atherosclerotic heart disease of native coronary artery without angina pectoris: Secondary | ICD-10-CM | POA: Diagnosis not present

## 2020-09-27 DIAGNOSIS — F419 Anxiety disorder, unspecified: Secondary | ICD-10-CM | POA: Diagnosis not present

## 2020-10-04 DIAGNOSIS — Z87891 Personal history of nicotine dependence: Secondary | ICD-10-CM | POA: Diagnosis not present

## 2020-10-06 DIAGNOSIS — Z79899 Other long term (current) drug therapy: Secondary | ICD-10-CM | POA: Diagnosis not present

## 2020-10-06 DIAGNOSIS — M5137 Other intervertebral disc degeneration, lumbosacral region: Secondary | ICD-10-CM | POA: Diagnosis not present

## 2020-10-06 DIAGNOSIS — M542 Cervicalgia: Secondary | ICD-10-CM | POA: Diagnosis not present

## 2020-10-06 DIAGNOSIS — I6523 Occlusion and stenosis of bilateral carotid arteries: Secondary | ICD-10-CM | POA: Diagnosis not present

## 2020-10-06 DIAGNOSIS — M545 Low back pain, unspecified: Secondary | ICD-10-CM | POA: Diagnosis not present

## 2020-11-10 DIAGNOSIS — G8929 Other chronic pain: Secondary | ICD-10-CM | POA: Diagnosis not present

## 2020-11-10 DIAGNOSIS — M545 Low back pain, unspecified: Secondary | ICD-10-CM | POA: Diagnosis not present

## 2020-11-10 DIAGNOSIS — G894 Chronic pain syndrome: Secondary | ICD-10-CM | POA: Diagnosis not present

## 2020-11-10 DIAGNOSIS — Z79899 Other long term (current) drug therapy: Secondary | ICD-10-CM | POA: Diagnosis not present

## 2020-11-10 DIAGNOSIS — M542 Cervicalgia: Secondary | ICD-10-CM | POA: Diagnosis not present

## 2020-11-23 ENCOUNTER — Ambulatory Visit: Payer: PPO | Admitting: Cardiology

## 2020-11-24 DIAGNOSIS — J3089 Other allergic rhinitis: Secondary | ICD-10-CM | POA: Diagnosis not present

## 2020-11-24 DIAGNOSIS — I69391 Dysphagia following cerebral infarction: Secondary | ICD-10-CM | POA: Diagnosis not present

## 2020-11-24 DIAGNOSIS — R1310 Dysphagia, unspecified: Secondary | ICD-10-CM | POA: Diagnosis not present

## 2020-11-24 DIAGNOSIS — R279 Unspecified lack of coordination: Secondary | ICD-10-CM | POA: Diagnosis not present

## 2020-11-24 DIAGNOSIS — Z6830 Body mass index (BMI) 30.0-30.9, adult: Secondary | ICD-10-CM | POA: Diagnosis not present

## 2020-11-24 DIAGNOSIS — R1312 Dysphagia, oropharyngeal phase: Secondary | ICD-10-CM | POA: Diagnosis not present

## 2020-11-24 DIAGNOSIS — R9431 Abnormal electrocardiogram [ECG] [EKG]: Secondary | ICD-10-CM | POA: Diagnosis not present

## 2020-11-24 DIAGNOSIS — E222 Syndrome of inappropriate secretion of antidiuretic hormone: Secondary | ICD-10-CM | POA: Diagnosis not present

## 2020-11-24 DIAGNOSIS — I69320 Aphasia following cerebral infarction: Secondary | ICD-10-CM | POA: Diagnosis not present

## 2020-11-24 DIAGNOSIS — F32A Depression, unspecified: Secondary | ICD-10-CM | POA: Diagnosis not present

## 2020-11-24 DIAGNOSIS — J9600 Acute respiratory failure, unspecified whether with hypoxia or hypercapnia: Secondary | ICD-10-CM | POA: Diagnosis not present

## 2020-11-24 DIAGNOSIS — E785 Hyperlipidemia, unspecified: Secondary | ICD-10-CM | POA: Diagnosis not present

## 2020-11-24 DIAGNOSIS — M6281 Muscle weakness (generalized): Secondary | ICD-10-CM | POA: Diagnosis not present

## 2020-11-24 DIAGNOSIS — F3289 Other specified depressive episodes: Secondary | ICD-10-CM | POA: Diagnosis not present

## 2020-11-24 DIAGNOSIS — I619 Nontraumatic intracerebral hemorrhage, unspecified: Secondary | ICD-10-CM | POA: Diagnosis not present

## 2020-11-24 DIAGNOSIS — Z049 Encounter for examination and observation for unspecified reason: Secondary | ICD-10-CM | POA: Diagnosis not present

## 2020-11-24 DIAGNOSIS — E878 Other disorders of electrolyte and fluid balance, not elsewhere classified: Secondary | ICD-10-CM | POA: Diagnosis not present

## 2020-11-24 DIAGNOSIS — I69191 Dysphagia following nontraumatic intracerebral hemorrhage: Secondary | ICD-10-CM | POA: Diagnosis not present

## 2020-11-24 DIAGNOSIS — I2510 Atherosclerotic heart disease of native coronary artery without angina pectoris: Secondary | ICD-10-CM | POA: Diagnosis not present

## 2020-11-24 DIAGNOSIS — I10 Essential (primary) hypertension: Secondary | ICD-10-CM | POA: Diagnosis not present

## 2020-11-24 DIAGNOSIS — J441 Chronic obstructive pulmonary disease with (acute) exacerbation: Secondary | ICD-10-CM | POA: Diagnosis not present

## 2020-11-24 DIAGNOSIS — R278 Other lack of coordination: Secondary | ICD-10-CM | POA: Diagnosis not present

## 2020-11-24 DIAGNOSIS — Z950 Presence of cardiac pacemaker: Secondary | ICD-10-CM | POA: Diagnosis not present

## 2020-11-24 DIAGNOSIS — Z7901 Long term (current) use of anticoagulants: Secondary | ICD-10-CM | POA: Diagnosis not present

## 2020-11-24 DIAGNOSIS — I482 Chronic atrial fibrillation, unspecified: Secondary | ICD-10-CM | POA: Diagnosis not present

## 2020-11-24 DIAGNOSIS — R1319 Other dysphagia: Secondary | ICD-10-CM | POA: Diagnosis not present

## 2020-11-24 DIAGNOSIS — J449 Chronic obstructive pulmonary disease, unspecified: Secondary | ICD-10-CM | POA: Diagnosis not present

## 2020-11-24 DIAGNOSIS — E7800 Pure hypercholesterolemia, unspecified: Secondary | ICD-10-CM | POA: Diagnosis not present

## 2020-11-24 DIAGNOSIS — G8929 Other chronic pain: Secondary | ICD-10-CM | POA: Diagnosis not present

## 2020-11-24 DIAGNOSIS — F10931 Alcohol use, unspecified with withdrawal delirium: Secondary | ICD-10-CM | POA: Diagnosis not present

## 2020-11-24 DIAGNOSIS — G936 Cerebral edema: Secondary | ICD-10-CM | POA: Diagnosis not present

## 2020-11-24 DIAGNOSIS — G894 Chronic pain syndrome: Secondary | ICD-10-CM | POA: Diagnosis not present

## 2020-11-24 DIAGNOSIS — I629 Nontraumatic intracranial hemorrhage, unspecified: Secondary | ICD-10-CM | POA: Diagnosis not present

## 2020-11-24 DIAGNOSIS — R262 Difficulty in walking, not elsewhere classified: Secondary | ICD-10-CM | POA: Diagnosis not present

## 2020-11-24 DIAGNOSIS — R4701 Aphasia: Secondary | ICD-10-CM | POA: Diagnosis not present

## 2020-11-24 DIAGNOSIS — G9340 Encephalopathy, unspecified: Secondary | ICD-10-CM | POA: Diagnosis not present

## 2020-11-24 DIAGNOSIS — J018 Other acute sinusitis: Secondary | ICD-10-CM | POA: Diagnosis not present

## 2020-11-24 DIAGNOSIS — J9620 Acute and chronic respiratory failure, unspecified whether with hypoxia or hypercapnia: Secondary | ICD-10-CM | POA: Diagnosis not present

## 2020-11-24 DIAGNOSIS — R41841 Cognitive communication deficit: Secondary | ICD-10-CM | POA: Diagnosis not present

## 2020-11-24 DIAGNOSIS — Z736 Limitation of activities due to disability: Secondary | ICD-10-CM | POA: Diagnosis not present

## 2020-11-24 DIAGNOSIS — U071 COVID-19: Secondary | ICD-10-CM | POA: Diagnosis not present

## 2020-11-24 DIAGNOSIS — Z743 Need for continuous supervision: Secondary | ICD-10-CM | POA: Diagnosis not present

## 2020-11-24 DIAGNOSIS — G9349 Other encephalopathy: Secondary | ICD-10-CM | POA: Diagnosis not present

## 2020-11-24 DIAGNOSIS — D649 Anemia, unspecified: Secondary | ICD-10-CM | POA: Diagnosis not present

## 2020-11-24 DIAGNOSIS — Z87891 Personal history of nicotine dependence: Secondary | ICD-10-CM | POA: Diagnosis not present

## 2020-11-24 DIAGNOSIS — E876 Hypokalemia: Secondary | ICD-10-CM | POA: Diagnosis not present

## 2020-11-24 DIAGNOSIS — R4182 Altered mental status, unspecified: Secondary | ICD-10-CM | POA: Diagnosis not present

## 2020-11-24 DIAGNOSIS — I69220 Aphasia following other nontraumatic intracranial hemorrhage: Secondary | ICD-10-CM | POA: Diagnosis not present

## 2020-11-24 DIAGNOSIS — R410 Disorientation, unspecified: Secondary | ICD-10-CM | POA: Diagnosis not present

## 2020-11-24 DIAGNOSIS — I4891 Unspecified atrial fibrillation: Secondary | ICD-10-CM | POA: Diagnosis not present

## 2020-11-24 DIAGNOSIS — E871 Hypo-osmolality and hyponatremia: Secondary | ICD-10-CM | POA: Diagnosis not present

## 2020-11-24 DIAGNOSIS — E854 Organ-limited amyloidosis: Secondary | ICD-10-CM | POA: Diagnosis not present

## 2020-11-24 DIAGNOSIS — R531 Weakness: Secondary | ICD-10-CM | POA: Diagnosis not present

## 2020-11-24 DIAGNOSIS — I517 Cardiomegaly: Secondary | ICD-10-CM | POA: Diagnosis not present

## 2020-11-24 DIAGNOSIS — I639 Cerebral infarction, unspecified: Secondary | ICD-10-CM | POA: Diagnosis not present

## 2020-11-24 DIAGNOSIS — F10239 Alcohol dependence with withdrawal, unspecified: Secondary | ICD-10-CM | POA: Diagnosis not present

## 2020-11-24 DIAGNOSIS — Z20822 Contact with and (suspected) exposure to covid-19: Secondary | ICD-10-CM | POA: Diagnosis not present

## 2020-11-24 DIAGNOSIS — I480 Paroxysmal atrial fibrillation: Secondary | ICD-10-CM | POA: Diagnosis not present

## 2020-11-24 DIAGNOSIS — I611 Nontraumatic intracerebral hemorrhage in hemisphere, cortical: Secondary | ICD-10-CM | POA: Diagnosis not present

## 2020-11-25 DIAGNOSIS — I619 Nontraumatic intracerebral hemorrhage, unspecified: Secondary | ICD-10-CM | POA: Diagnosis not present

## 2020-11-25 DIAGNOSIS — I4891 Unspecified atrial fibrillation: Secondary | ICD-10-CM | POA: Diagnosis not present

## 2020-11-25 DIAGNOSIS — G9340 Encephalopathy, unspecified: Secondary | ICD-10-CM | POA: Diagnosis not present

## 2020-11-25 DIAGNOSIS — I10 Essential (primary) hypertension: Secondary | ICD-10-CM | POA: Diagnosis not present

## 2020-11-26 DIAGNOSIS — I4891 Unspecified atrial fibrillation: Secondary | ICD-10-CM | POA: Diagnosis not present

## 2020-11-26 DIAGNOSIS — G9340 Encephalopathy, unspecified: Secondary | ICD-10-CM | POA: Diagnosis not present

## 2020-11-26 DIAGNOSIS — R9431 Abnormal electrocardiogram [ECG] [EKG]: Secondary | ICD-10-CM | POA: Diagnosis not present

## 2020-11-26 DIAGNOSIS — I10 Essential (primary) hypertension: Secondary | ICD-10-CM | POA: Diagnosis not present

## 2020-11-26 DIAGNOSIS — I619 Nontraumatic intracerebral hemorrhage, unspecified: Secondary | ICD-10-CM | POA: Diagnosis not present

## 2020-11-27 DIAGNOSIS — Z87891 Personal history of nicotine dependence: Secondary | ICD-10-CM | POA: Diagnosis not present

## 2020-11-27 DIAGNOSIS — I4891 Unspecified atrial fibrillation: Secondary | ICD-10-CM | POA: Diagnosis not present

## 2020-11-27 DIAGNOSIS — R1310 Dysphagia, unspecified: Secondary | ICD-10-CM | POA: Diagnosis not present

## 2020-11-27 DIAGNOSIS — I619 Nontraumatic intracerebral hemorrhage, unspecified: Secondary | ICD-10-CM | POA: Diagnosis not present

## 2020-11-27 DIAGNOSIS — J449 Chronic obstructive pulmonary disease, unspecified: Secondary | ICD-10-CM | POA: Diagnosis not present

## 2020-11-27 DIAGNOSIS — J9620 Acute and chronic respiratory failure, unspecified whether with hypoxia or hypercapnia: Secondary | ICD-10-CM | POA: Diagnosis not present

## 2020-11-27 DIAGNOSIS — I69191 Dysphagia following nontraumatic intracerebral hemorrhage: Secondary | ICD-10-CM | POA: Diagnosis not present

## 2020-11-27 DIAGNOSIS — I517 Cardiomegaly: Secondary | ICD-10-CM | POA: Diagnosis not present

## 2020-11-28 DIAGNOSIS — I4891 Unspecified atrial fibrillation: Secondary | ICD-10-CM | POA: Diagnosis not present

## 2020-11-28 DIAGNOSIS — Z87891 Personal history of nicotine dependence: Secondary | ICD-10-CM | POA: Diagnosis not present

## 2020-11-28 DIAGNOSIS — J9620 Acute and chronic respiratory failure, unspecified whether with hypoxia or hypercapnia: Secondary | ICD-10-CM | POA: Diagnosis not present

## 2020-11-28 DIAGNOSIS — I69191 Dysphagia following nontraumatic intracerebral hemorrhage: Secondary | ICD-10-CM | POA: Diagnosis not present

## 2020-11-28 DIAGNOSIS — I619 Nontraumatic intracerebral hemorrhage, unspecified: Secondary | ICD-10-CM | POA: Diagnosis not present

## 2020-11-28 DIAGNOSIS — J449 Chronic obstructive pulmonary disease, unspecified: Secondary | ICD-10-CM | POA: Diagnosis not present

## 2020-11-28 DIAGNOSIS — R1310 Dysphagia, unspecified: Secondary | ICD-10-CM | POA: Diagnosis not present

## 2020-11-29 DIAGNOSIS — I480 Paroxysmal atrial fibrillation: Secondary | ICD-10-CM | POA: Diagnosis not present

## 2020-11-29 DIAGNOSIS — E871 Hypo-osmolality and hyponatremia: Secondary | ICD-10-CM | POA: Diagnosis not present

## 2020-11-29 DIAGNOSIS — I10 Essential (primary) hypertension: Secondary | ICD-10-CM | POA: Diagnosis not present

## 2020-11-29 DIAGNOSIS — I619 Nontraumatic intracerebral hemorrhage, unspecified: Secondary | ICD-10-CM

## 2020-11-29 HISTORY — DX: Nontraumatic intracerebral hemorrhage, unspecified: I61.9

## 2020-11-30 DIAGNOSIS — I480 Paroxysmal atrial fibrillation: Secondary | ICD-10-CM | POA: Diagnosis not present

## 2020-11-30 DIAGNOSIS — R9431 Abnormal electrocardiogram [ECG] [EKG]: Secondary | ICD-10-CM | POA: Diagnosis not present

## 2020-11-30 DIAGNOSIS — I10 Essential (primary) hypertension: Secondary | ICD-10-CM | POA: Diagnosis not present

## 2020-11-30 DIAGNOSIS — I619 Nontraumatic intracerebral hemorrhage, unspecified: Secondary | ICD-10-CM | POA: Diagnosis not present

## 2020-11-30 DIAGNOSIS — E871 Hypo-osmolality and hyponatremia: Secondary | ICD-10-CM | POA: Diagnosis not present

## 2020-11-30 DIAGNOSIS — Z950 Presence of cardiac pacemaker: Secondary | ICD-10-CM | POA: Diagnosis not present

## 2020-12-01 DIAGNOSIS — I480 Paroxysmal atrial fibrillation: Secondary | ICD-10-CM | POA: Diagnosis not present

## 2020-12-01 DIAGNOSIS — I619 Nontraumatic intracerebral hemorrhage, unspecified: Secondary | ICD-10-CM | POA: Diagnosis not present

## 2020-12-01 DIAGNOSIS — I10 Essential (primary) hypertension: Secondary | ICD-10-CM | POA: Diagnosis not present

## 2020-12-01 DIAGNOSIS — E871 Hypo-osmolality and hyponatremia: Secondary | ICD-10-CM | POA: Diagnosis not present

## 2020-12-02 DIAGNOSIS — J3089 Other allergic rhinitis: Secondary | ICD-10-CM | POA: Diagnosis not present

## 2020-12-02 DIAGNOSIS — E871 Hypo-osmolality and hyponatremia: Secondary | ICD-10-CM | POA: Diagnosis not present

## 2020-12-02 DIAGNOSIS — I2510 Atherosclerotic heart disease of native coronary artery without angina pectoris: Secondary | ICD-10-CM | POA: Diagnosis not present

## 2020-12-02 DIAGNOSIS — J449 Chronic obstructive pulmonary disease, unspecified: Secondary | ICD-10-CM | POA: Diagnosis not present

## 2020-12-02 DIAGNOSIS — R1319 Other dysphagia: Secondary | ICD-10-CM | POA: Diagnosis not present

## 2020-12-02 DIAGNOSIS — U071 COVID-19: Secondary | ICD-10-CM | POA: Diagnosis not present

## 2020-12-02 DIAGNOSIS — J441 Chronic obstructive pulmonary disease with (acute) exacerbation: Secondary | ICD-10-CM | POA: Diagnosis not present

## 2020-12-02 DIAGNOSIS — R4701 Aphasia: Secondary | ICD-10-CM | POA: Diagnosis not present

## 2020-12-02 DIAGNOSIS — I69320 Aphasia following cerebral infarction: Secondary | ICD-10-CM | POA: Diagnosis not present

## 2020-12-02 DIAGNOSIS — R262 Difficulty in walking, not elsewhere classified: Secondary | ICD-10-CM | POA: Diagnosis not present

## 2020-12-02 DIAGNOSIS — I69220 Aphasia following other nontraumatic intracranial hemorrhage: Secondary | ICD-10-CM | POA: Diagnosis not present

## 2020-12-02 DIAGNOSIS — E785 Hyperlipidemia, unspecified: Secondary | ICD-10-CM | POA: Diagnosis not present

## 2020-12-02 DIAGNOSIS — Z736 Limitation of activities due to disability: Secondary | ICD-10-CM | POA: Diagnosis not present

## 2020-12-02 DIAGNOSIS — R0602 Shortness of breath: Secondary | ICD-10-CM | POA: Diagnosis not present

## 2020-12-02 DIAGNOSIS — R109 Unspecified abdominal pain: Secondary | ICD-10-CM | POA: Diagnosis not present

## 2020-12-02 DIAGNOSIS — M6281 Muscle weakness (generalized): Secondary | ICD-10-CM | POA: Diagnosis not present

## 2020-12-02 DIAGNOSIS — R279 Unspecified lack of coordination: Secondary | ICD-10-CM | POA: Diagnosis not present

## 2020-12-02 DIAGNOSIS — F10931 Alcohol use, unspecified with withdrawal delirium: Secondary | ICD-10-CM | POA: Diagnosis not present

## 2020-12-02 DIAGNOSIS — Z743 Need for continuous supervision: Secondary | ICD-10-CM | POA: Diagnosis not present

## 2020-12-02 DIAGNOSIS — R531 Weakness: Secondary | ICD-10-CM | POA: Diagnosis not present

## 2020-12-02 DIAGNOSIS — I480 Paroxysmal atrial fibrillation: Secondary | ICD-10-CM | POA: Diagnosis not present

## 2020-12-02 DIAGNOSIS — E222 Syndrome of inappropriate secretion of antidiuretic hormone: Secondary | ICD-10-CM | POA: Diagnosis not present

## 2020-12-02 DIAGNOSIS — I10 Essential (primary) hypertension: Secondary | ICD-10-CM | POA: Diagnosis not present

## 2020-12-02 DIAGNOSIS — G894 Chronic pain syndrome: Secondary | ICD-10-CM | POA: Diagnosis not present

## 2020-12-02 DIAGNOSIS — R41841 Cognitive communication deficit: Secondary | ICD-10-CM | POA: Diagnosis not present

## 2020-12-02 DIAGNOSIS — I619 Nontraumatic intracerebral hemorrhage, unspecified: Secondary | ICD-10-CM | POA: Diagnosis not present

## 2020-12-02 DIAGNOSIS — I482 Chronic atrial fibrillation, unspecified: Secondary | ICD-10-CM | POA: Diagnosis not present

## 2020-12-02 DIAGNOSIS — J018 Other acute sinusitis: Secondary | ICD-10-CM | POA: Diagnosis not present

## 2020-12-02 DIAGNOSIS — I69391 Dysphagia following cerebral infarction: Secondary | ICD-10-CM | POA: Diagnosis not present

## 2020-12-02 DIAGNOSIS — I639 Cerebral infarction, unspecified: Secondary | ICD-10-CM | POA: Diagnosis not present

## 2020-12-02 DIAGNOSIS — Z7189 Other specified counseling: Secondary | ICD-10-CM | POA: Diagnosis not present

## 2020-12-02 DIAGNOSIS — R278 Other lack of coordination: Secondary | ICD-10-CM | POA: Diagnosis not present

## 2020-12-02 DIAGNOSIS — D649 Anemia, unspecified: Secondary | ICD-10-CM | POA: Diagnosis not present

## 2020-12-02 DIAGNOSIS — F3289 Other specified depressive episodes: Secondary | ICD-10-CM | POA: Diagnosis not present

## 2020-12-04 DIAGNOSIS — F10931 Alcohol use, unspecified with withdrawal delirium: Secondary | ICD-10-CM | POA: Diagnosis not present

## 2020-12-04 DIAGNOSIS — Z7189 Other specified counseling: Secondary | ICD-10-CM | POA: Diagnosis not present

## 2020-12-04 DIAGNOSIS — G894 Chronic pain syndrome: Secondary | ICD-10-CM | POA: Diagnosis not present

## 2020-12-04 DIAGNOSIS — I480 Paroxysmal atrial fibrillation: Secondary | ICD-10-CM | POA: Diagnosis not present

## 2020-12-04 DIAGNOSIS — F3289 Other specified depressive episodes: Secondary | ICD-10-CM | POA: Diagnosis not present

## 2020-12-04 DIAGNOSIS — E785 Hyperlipidemia, unspecified: Secondary | ICD-10-CM | POA: Diagnosis not present

## 2020-12-04 DIAGNOSIS — I10 Essential (primary) hypertension: Secondary | ICD-10-CM | POA: Diagnosis not present

## 2020-12-04 DIAGNOSIS — J449 Chronic obstructive pulmonary disease, unspecified: Secondary | ICD-10-CM | POA: Diagnosis not present

## 2020-12-04 DIAGNOSIS — I619 Nontraumatic intracerebral hemorrhage, unspecified: Secondary | ICD-10-CM | POA: Diagnosis not present

## 2020-12-08 DIAGNOSIS — U071 COVID-19: Secondary | ICD-10-CM | POA: Diagnosis not present

## 2020-12-08 DIAGNOSIS — J449 Chronic obstructive pulmonary disease, unspecified: Secondary | ICD-10-CM | POA: Diagnosis not present

## 2020-12-13 DIAGNOSIS — U071 COVID-19: Secondary | ICD-10-CM | POA: Diagnosis not present

## 2020-12-13 DIAGNOSIS — J018 Other acute sinusitis: Secondary | ICD-10-CM | POA: Diagnosis not present

## 2020-12-13 DIAGNOSIS — J3089 Other allergic rhinitis: Secondary | ICD-10-CM | POA: Diagnosis not present

## 2020-12-13 DIAGNOSIS — F10931 Alcohol use, unspecified with withdrawal delirium: Secondary | ICD-10-CM | POA: Diagnosis not present

## 2020-12-13 DIAGNOSIS — E785 Hyperlipidemia, unspecified: Secondary | ICD-10-CM | POA: Diagnosis not present

## 2020-12-13 DIAGNOSIS — I480 Paroxysmal atrial fibrillation: Secondary | ICD-10-CM | POA: Diagnosis not present

## 2020-12-13 DIAGNOSIS — G894 Chronic pain syndrome: Secondary | ICD-10-CM | POA: Diagnosis not present

## 2020-12-13 DIAGNOSIS — F3289 Other specified depressive episodes: Secondary | ICD-10-CM | POA: Diagnosis not present

## 2020-12-13 DIAGNOSIS — D649 Anemia, unspecified: Secondary | ICD-10-CM | POA: Diagnosis not present

## 2020-12-13 DIAGNOSIS — J449 Chronic obstructive pulmonary disease, unspecified: Secondary | ICD-10-CM | POA: Diagnosis not present

## 2020-12-13 DIAGNOSIS — I619 Nontraumatic intracerebral hemorrhage, unspecified: Secondary | ICD-10-CM | POA: Diagnosis not present

## 2020-12-13 DIAGNOSIS — I10 Essential (primary) hypertension: Secondary | ICD-10-CM | POA: Diagnosis not present

## 2020-12-20 DIAGNOSIS — G894 Chronic pain syndrome: Secondary | ICD-10-CM | POA: Diagnosis not present

## 2020-12-20 DIAGNOSIS — J018 Other acute sinusitis: Secondary | ICD-10-CM | POA: Diagnosis not present

## 2020-12-20 DIAGNOSIS — F10931 Alcohol use, unspecified with withdrawal delirium: Secondary | ICD-10-CM | POA: Diagnosis not present

## 2020-12-20 DIAGNOSIS — I10 Essential (primary) hypertension: Secondary | ICD-10-CM | POA: Diagnosis not present

## 2020-12-20 DIAGNOSIS — I619 Nontraumatic intracerebral hemorrhage, unspecified: Secondary | ICD-10-CM | POA: Diagnosis not present

## 2020-12-20 DIAGNOSIS — J449 Chronic obstructive pulmonary disease, unspecified: Secondary | ICD-10-CM | POA: Diagnosis not present

## 2020-12-20 DIAGNOSIS — J3089 Other allergic rhinitis: Secondary | ICD-10-CM | POA: Diagnosis not present

## 2020-12-20 DIAGNOSIS — D649 Anemia, unspecified: Secondary | ICD-10-CM | POA: Diagnosis not present

## 2020-12-20 DIAGNOSIS — I480 Paroxysmal atrial fibrillation: Secondary | ICD-10-CM | POA: Diagnosis not present

## 2020-12-20 DIAGNOSIS — F3289 Other specified depressive episodes: Secondary | ICD-10-CM | POA: Diagnosis not present

## 2020-12-20 DIAGNOSIS — U071 COVID-19: Secondary | ICD-10-CM | POA: Diagnosis not present

## 2020-12-20 DIAGNOSIS — E785 Hyperlipidemia, unspecified: Secondary | ICD-10-CM | POA: Diagnosis not present

## 2021-01-03 DIAGNOSIS — E222 Syndrome of inappropriate secretion of antidiuretic hormone: Secondary | ICD-10-CM | POA: Insufficient documentation

## 2021-01-03 DIAGNOSIS — F10930 Alcohol use, unspecified with withdrawal, uncomplicated: Secondary | ICD-10-CM

## 2021-01-03 HISTORY — DX: Alcohol use, unspecified with withdrawal, uncomplicated: F10.930

## 2021-01-03 HISTORY — DX: Syndrome of inappropriate secretion of antidiuretic hormone: E22.2

## 2021-01-04 DIAGNOSIS — I480 Paroxysmal atrial fibrillation: Secondary | ICD-10-CM | POA: Diagnosis not present

## 2021-01-04 DIAGNOSIS — I10 Essential (primary) hypertension: Secondary | ICD-10-CM | POA: Diagnosis not present

## 2021-01-04 DIAGNOSIS — F10230 Alcohol dependence with withdrawal, uncomplicated: Secondary | ICD-10-CM | POA: Diagnosis not present

## 2021-01-04 DIAGNOSIS — I619 Nontraumatic intracerebral hemorrhage, unspecified: Secondary | ICD-10-CM | POA: Diagnosis not present

## 2021-01-04 DIAGNOSIS — E222 Syndrome of inappropriate secretion of antidiuretic hormone: Secondary | ICD-10-CM | POA: Diagnosis not present

## 2021-01-10 DIAGNOSIS — G894 Chronic pain syndrome: Secondary | ICD-10-CM | POA: Diagnosis not present

## 2021-01-10 DIAGNOSIS — M545 Low back pain, unspecified: Secondary | ICD-10-CM | POA: Diagnosis not present

## 2021-01-10 DIAGNOSIS — Z79899 Other long term (current) drug therapy: Secondary | ICD-10-CM | POA: Diagnosis not present

## 2021-01-10 DIAGNOSIS — M5137 Other intervertebral disc degeneration, lumbosacral region: Secondary | ICD-10-CM | POA: Diagnosis not present

## 2021-01-11 DIAGNOSIS — I2510 Atherosclerotic heart disease of native coronary artery without angina pectoris: Secondary | ICD-10-CM | POA: Diagnosis not present

## 2021-01-11 DIAGNOSIS — I480 Paroxysmal atrial fibrillation: Secondary | ICD-10-CM | POA: Diagnosis not present

## 2021-01-11 DIAGNOSIS — R609 Edema, unspecified: Secondary | ICD-10-CM | POA: Diagnosis not present

## 2021-01-11 DIAGNOSIS — S61411A Laceration without foreign body of right hand, initial encounter: Secondary | ICD-10-CM | POA: Insufficient documentation

## 2021-01-11 DIAGNOSIS — S51811A Laceration without foreign body of right forearm, initial encounter: Secondary | ICD-10-CM

## 2021-01-11 DIAGNOSIS — T148XXA Other injury of unspecified body region, initial encounter: Secondary | ICD-10-CM | POA: Diagnosis not present

## 2021-01-11 DIAGNOSIS — Z7951 Long term (current) use of inhaled steroids: Secondary | ICD-10-CM | POA: Diagnosis not present

## 2021-01-11 DIAGNOSIS — K432 Incisional hernia without obstruction or gangrene: Secondary | ICD-10-CM | POA: Diagnosis not present

## 2021-01-11 DIAGNOSIS — Z79899 Other long term (current) drug therapy: Secondary | ICD-10-CM | POA: Diagnosis not present

## 2021-01-11 DIAGNOSIS — I10 Essential (primary) hypertension: Secondary | ICD-10-CM | POA: Diagnosis not present

## 2021-01-11 DIAGNOSIS — S20211A Contusion of right front wall of thorax, initial encounter: Secondary | ICD-10-CM | POA: Insufficient documentation

## 2021-01-11 DIAGNOSIS — L089 Local infection of the skin and subcutaneous tissue, unspecified: Secondary | ICD-10-CM

## 2021-01-11 HISTORY — DX: Local infection of the skin and subcutaneous tissue, unspecified: L08.9

## 2021-01-11 HISTORY — DX: Laceration without foreign body of right hand, initial encounter: S61.411A

## 2021-01-11 HISTORY — DX: Contusion of right front wall of thorax, initial encounter: S20.211A

## 2021-01-11 HISTORY — DX: Laceration without foreign body of right forearm, initial encounter: S51.811A

## 2021-01-12 DIAGNOSIS — S60511A Abrasion of right hand, initial encounter: Secondary | ICD-10-CM | POA: Diagnosis not present

## 2021-01-12 DIAGNOSIS — L03113 Cellulitis of right upper limb: Secondary | ICD-10-CM | POA: Diagnosis not present

## 2021-01-16 DIAGNOSIS — K432 Incisional hernia without obstruction or gangrene: Secondary | ICD-10-CM | POA: Diagnosis not present

## 2021-01-18 DIAGNOSIS — Z7951 Long term (current) use of inhaled steroids: Secondary | ICD-10-CM | POA: Diagnosis not present

## 2021-01-18 DIAGNOSIS — L089 Local infection of the skin and subcutaneous tissue, unspecified: Secondary | ICD-10-CM | POA: Diagnosis not present

## 2021-01-18 DIAGNOSIS — T148XXA Other injury of unspecified body region, initial encounter: Secondary | ICD-10-CM | POA: Diagnosis not present

## 2021-01-18 DIAGNOSIS — Z7982 Long term (current) use of aspirin: Secondary | ICD-10-CM | POA: Diagnosis not present

## 2021-01-18 DIAGNOSIS — Z79899 Other long term (current) drug therapy: Secondary | ICD-10-CM | POA: Diagnosis not present

## 2021-01-18 DIAGNOSIS — I10 Essential (primary) hypertension: Secondary | ICD-10-CM | POA: Diagnosis not present

## 2021-02-07 DIAGNOSIS — Z79891 Long term (current) use of opiate analgesic: Secondary | ICD-10-CM | POA: Diagnosis not present

## 2021-02-07 DIAGNOSIS — G894 Chronic pain syndrome: Secondary | ICD-10-CM | POA: Diagnosis not present

## 2021-02-07 DIAGNOSIS — G8929 Other chronic pain: Secondary | ICD-10-CM | POA: Diagnosis not present

## 2021-02-07 DIAGNOSIS — M542 Cervicalgia: Secondary | ICD-10-CM | POA: Diagnosis not present

## 2021-02-07 DIAGNOSIS — M5137 Other intervertebral disc degeneration, lumbosacral region: Secondary | ICD-10-CM | POA: Diagnosis not present

## 2021-02-07 DIAGNOSIS — Z79899 Other long term (current) drug therapy: Secondary | ICD-10-CM | POA: Diagnosis not present

## 2021-02-26 DIAGNOSIS — I619 Nontraumatic intracerebral hemorrhage, unspecified: Secondary | ICD-10-CM | POA: Diagnosis not present

## 2021-02-26 DIAGNOSIS — R413 Other amnesia: Secondary | ICD-10-CM | POA: Diagnosis not present

## 2021-03-07 DIAGNOSIS — G894 Chronic pain syndrome: Secondary | ICD-10-CM | POA: Diagnosis not present

## 2021-03-07 DIAGNOSIS — M545 Low back pain, unspecified: Secondary | ICD-10-CM | POA: Diagnosis not present

## 2021-03-07 DIAGNOSIS — Z79899 Other long term (current) drug therapy: Secondary | ICD-10-CM | POA: Diagnosis not present

## 2021-03-07 DIAGNOSIS — M542 Cervicalgia: Secondary | ICD-10-CM | POA: Diagnosis not present

## 2021-03-07 DIAGNOSIS — G8929 Other chronic pain: Secondary | ICD-10-CM | POA: Diagnosis not present

## 2021-03-12 DIAGNOSIS — I619 Nontraumatic intracerebral hemorrhage, unspecified: Secondary | ICD-10-CM | POA: Diagnosis not present

## 2021-03-12 DIAGNOSIS — I6782 Cerebral ischemia: Secondary | ICD-10-CM | POA: Diagnosis not present

## 2021-03-12 DIAGNOSIS — R413 Other amnesia: Secondary | ICD-10-CM | POA: Diagnosis not present

## 2021-03-12 DIAGNOSIS — G9389 Other specified disorders of brain: Secondary | ICD-10-CM | POA: Diagnosis not present

## 2021-03-12 DIAGNOSIS — J321 Chronic frontal sinusitis: Secondary | ICD-10-CM | POA: Diagnosis not present

## 2021-03-27 ENCOUNTER — Telehealth: Payer: Self-pay

## 2021-03-27 DIAGNOSIS — K432 Incisional hernia without obstruction or gangrene: Secondary | ICD-10-CM | POA: Diagnosis not present

## 2021-03-27 NOTE — Telephone Encounter (Signed)
   [facility] Pre-operative Risk Assessment    Patient Name: Randy Chase  DOB: 05-13-1951  MRN: [id number]   HEARTCARE STAFF: - Please ensure there is not already an duplicate clearance open for this procedure. - Under Visit Info/Reason for Call, type in Other and utilize the format Clearance MM/DD/YY or Clearance TBD. Do not use dashes or single digits. - If request is for dental extraction, please clarify the # of teeth to be extracted.  Request for surgical clearance:  1. What type of surgery is being performed? Laparoscopic incisional hernia repair with mesh, possible open incision.   2. When is this surgery scheduled? 04/11/21   3. What type of clearance is required (medical clearance vs. Pharmacy clearance to hold med vs. Both)? Both  4. Are there any medications that need to be held prior to surgery and how long? Aspirin   5. Practice name and name of physician performing surgery? [facility]-  Dr. Glynda Jaeger   6. What is the office phone number? [phone]   7.   What is the office fax number? [phone]  8.   Anesthesia type (None, local, MAC, general) ? General   Lowella Grip 03/27/2021, 11:45 AM  _________________________________________________________________   (provider comments below)

## 2021-03-28 NOTE — Telephone Encounter (Signed)
Left VM

## 2021-03-29 DIAGNOSIS — Z23 Encounter for immunization: Secondary | ICD-10-CM | POA: Diagnosis not present

## 2021-03-29 DIAGNOSIS — Z79891 Long term (current) use of opiate analgesic: Secondary | ICD-10-CM | POA: Diagnosis not present

## 2021-03-29 DIAGNOSIS — B354 Tinea corporis: Secondary | ICD-10-CM

## 2021-03-29 DIAGNOSIS — F330 Major depressive disorder, recurrent, mild: Secondary | ICD-10-CM | POA: Diagnosis not present

## 2021-03-29 DIAGNOSIS — Z0000 Encounter for general adult medical examination without abnormal findings: Secondary | ICD-10-CM | POA: Diagnosis not present

## 2021-03-29 DIAGNOSIS — E7800 Pure hypercholesterolemia, unspecified: Secondary | ICD-10-CM | POA: Diagnosis not present

## 2021-03-29 DIAGNOSIS — I480 Paroxysmal atrial fibrillation: Secondary | ICD-10-CM | POA: Diagnosis not present

## 2021-03-29 DIAGNOSIS — I2510 Atherosclerotic heart disease of native coronary artery without angina pectoris: Secondary | ICD-10-CM | POA: Diagnosis not present

## 2021-03-29 DIAGNOSIS — Z79899 Other long term (current) drug therapy: Secondary | ICD-10-CM | POA: Diagnosis not present

## 2021-03-29 DIAGNOSIS — Z7982 Long term (current) use of aspirin: Secondary | ICD-10-CM | POA: Diagnosis not present

## 2021-03-29 DIAGNOSIS — I10 Essential (primary) hypertension: Secondary | ICD-10-CM | POA: Diagnosis not present

## 2021-03-29 DIAGNOSIS — R7303 Prediabetes: Secondary | ICD-10-CM | POA: Diagnosis not present

## 2021-03-29 DIAGNOSIS — Z9989 Dependence on other enabling machines and devices: Secondary | ICD-10-CM | POA: Diagnosis not present

## 2021-03-29 HISTORY — DX: Tinea corporis: B35.4

## 2021-03-30 NOTE — Telephone Encounter (Signed)
   Name: Randy Chase  DOB: May 10, 1951  MRN: [id number]   Primary Cardiologist: Jenean Lindau, MD  Chart reviewed as part of pre-operative protocol coverage. Patient was contacted 03/30/2021 in reference to pre-operative risk assessment for pending surgery as outlined below.  Randy Chase was last seen on 9/(01/2021 by Dr. Geraldo Pitter.  Since that day, Randy Chase has done fine from a cardiac standpoint. He can complete 4 METs without anginal complaints.  Therefore, based on ACC/AHA guidelines, the patient would be at acceptable risk for the planned procedure without further cardiovascular testing.   The patient was advised that if he develops new symptoms prior to surgery to contact our office to arrange for a follow-up visit, and he verbalized understanding.  He can hold aspirin 7 days prior to his upcoming hernia repair with plans to restart as soon as he is cleared to do so by his surgeon.  I will route this recommendation to the requesting party via Epic fax function and remove from pre-op pool. Please call with questions.  Abigail Butts, PA-C 03/30/2021, 2:01 PM

## 2021-03-30 NOTE — Telephone Encounter (Signed)
Spoke to [place] with Dr. Merdis Delay office. She is inquiring about clearance. She would like someone to call her about this. Her number is [phone]. She also provided a different number to reach patient for [place] if needed it is the patient's sister Lemuel Boodram [phone]. Will send to preop pool for someone to follow up.

## 2021-04-05 DIAGNOSIS — N179 Acute kidney failure, unspecified: Secondary | ICD-10-CM | POA: Diagnosis not present

## 2021-04-06 DIAGNOSIS — G894 Chronic pain syndrome: Secondary | ICD-10-CM | POA: Diagnosis not present

## 2021-04-06 DIAGNOSIS — Z79899 Other long term (current) drug therapy: Secondary | ICD-10-CM | POA: Diagnosis not present

## 2021-04-06 DIAGNOSIS — M542 Cervicalgia: Secondary | ICD-10-CM | POA: Diagnosis not present

## 2021-04-06 DIAGNOSIS — Z6827 Body mass index (BMI) 27.0-27.9, adult: Secondary | ICD-10-CM | POA: Diagnosis not present

## 2021-04-06 DIAGNOSIS — G8929 Other chronic pain: Secondary | ICD-10-CM | POA: Diagnosis not present

## 2021-04-06 DIAGNOSIS — M5137 Other intervertebral disc degeneration, lumbosacral region: Secondary | ICD-10-CM | POA: Diagnosis not present

## 2021-04-11 DIAGNOSIS — G894 Chronic pain syndrome: Secondary | ICD-10-CM | POA: Diagnosis not present

## 2021-04-11 DIAGNOSIS — J449 Chronic obstructive pulmonary disease, unspecified: Secondary | ICD-10-CM | POA: Diagnosis not present

## 2021-04-11 DIAGNOSIS — K660 Peritoneal adhesions (postprocedural) (postinfection): Secondary | ICD-10-CM | POA: Diagnosis not present

## 2021-04-11 DIAGNOSIS — Z7951 Long term (current) use of inhaled steroids: Secondary | ICD-10-CM | POA: Diagnosis not present

## 2021-04-11 DIAGNOSIS — Z7982 Long term (current) use of aspirin: Secondary | ICD-10-CM | POA: Diagnosis not present

## 2021-04-11 DIAGNOSIS — I2510 Atherosclerotic heart disease of native coronary artery without angina pectoris: Secondary | ICD-10-CM | POA: Diagnosis not present

## 2021-04-11 DIAGNOSIS — Z79891 Long term (current) use of opiate analgesic: Secondary | ICD-10-CM | POA: Diagnosis not present

## 2021-04-11 DIAGNOSIS — K430 Incisional hernia with obstruction, without gangrene: Secondary | ICD-10-CM | POA: Diagnosis not present

## 2021-04-11 DIAGNOSIS — E7800 Pure hypercholesterolemia, unspecified: Secondary | ICD-10-CM | POA: Diagnosis not present

## 2021-04-11 DIAGNOSIS — F330 Major depressive disorder, recurrent, mild: Secondary | ICD-10-CM | POA: Diagnosis not present

## 2021-04-11 DIAGNOSIS — I10 Essential (primary) hypertension: Secondary | ICD-10-CM | POA: Diagnosis not present

## 2021-04-11 DIAGNOSIS — M549 Dorsalgia, unspecified: Secondary | ICD-10-CM | POA: Diagnosis not present

## 2021-04-11 DIAGNOSIS — J984 Other disorders of lung: Secondary | ICD-10-CM | POA: Diagnosis not present

## 2021-04-11 DIAGNOSIS — R7303 Prediabetes: Secondary | ICD-10-CM | POA: Diagnosis not present

## 2021-04-11 DIAGNOSIS — I480 Paroxysmal atrial fibrillation: Secondary | ICD-10-CM | POA: Diagnosis not present

## 2021-04-11 DIAGNOSIS — Z87891 Personal history of nicotine dependence: Secondary | ICD-10-CM | POA: Diagnosis not present

## 2021-04-11 DIAGNOSIS — F419 Anxiety disorder, unspecified: Secondary | ICD-10-CM | POA: Diagnosis not present

## 2021-04-11 DIAGNOSIS — K432 Incisional hernia without obstruction or gangrene: Secondary | ICD-10-CM | POA: Diagnosis not present

## 2021-04-11 DIAGNOSIS — J438 Other emphysema: Secondary | ICD-10-CM | POA: Diagnosis not present

## 2021-05-04 DIAGNOSIS — G8929 Other chronic pain: Secondary | ICD-10-CM | POA: Diagnosis not present

## 2021-05-04 DIAGNOSIS — G894 Chronic pain syndrome: Secondary | ICD-10-CM | POA: Diagnosis not present

## 2021-05-04 DIAGNOSIS — M5137 Other intervertebral disc degeneration, lumbosacral region: Secondary | ICD-10-CM | POA: Diagnosis not present

## 2021-05-04 DIAGNOSIS — Z79899 Other long term (current) drug therapy: Secondary | ICD-10-CM | POA: Diagnosis not present

## 2021-05-04 DIAGNOSIS — M542 Cervicalgia: Secondary | ICD-10-CM | POA: Diagnosis not present

## 2021-05-26 DIAGNOSIS — G8929 Other chronic pain: Secondary | ICD-10-CM | POA: Diagnosis not present

## 2021-05-26 DIAGNOSIS — M5137 Other intervertebral disc degeneration, lumbosacral region: Secondary | ICD-10-CM | POA: Diagnosis not present

## 2021-05-26 DIAGNOSIS — G894 Chronic pain syndrome: Secondary | ICD-10-CM | POA: Diagnosis not present

## 2021-05-26 DIAGNOSIS — Z79899 Other long term (current) drug therapy: Secondary | ICD-10-CM | POA: Diagnosis not present

## 2021-05-26 DIAGNOSIS — M542 Cervicalgia: Secondary | ICD-10-CM | POA: Diagnosis not present

## 2021-06-01 DIAGNOSIS — G8929 Other chronic pain: Secondary | ICD-10-CM | POA: Diagnosis not present

## 2021-06-01 DIAGNOSIS — M542 Cervicalgia: Secondary | ICD-10-CM | POA: Diagnosis not present

## 2021-06-01 DIAGNOSIS — G894 Chronic pain syndrome: Secondary | ICD-10-CM | POA: Diagnosis not present

## 2021-06-01 DIAGNOSIS — M545 Low back pain, unspecified: Secondary | ICD-10-CM | POA: Diagnosis not present

## 2021-06-01 DIAGNOSIS — Z79899 Other long term (current) drug therapy: Secondary | ICD-10-CM | POA: Diagnosis not present

## 2021-06-04 DIAGNOSIS — Z79899 Other long term (current) drug therapy: Secondary | ICD-10-CM | POA: Diagnosis not present

## 2021-06-04 DIAGNOSIS — M3501 Sicca syndrome with keratoconjunctivitis: Secondary | ICD-10-CM | POA: Diagnosis not present

## 2021-06-04 DIAGNOSIS — H2513 Age-related nuclear cataract, bilateral: Secondary | ICD-10-CM | POA: Diagnosis not present

## 2021-06-29 DIAGNOSIS — Z6826 Body mass index (BMI) 26.0-26.9, adult: Secondary | ICD-10-CM | POA: Diagnosis not present

## 2021-06-29 DIAGNOSIS — M542 Cervicalgia: Secondary | ICD-10-CM | POA: Diagnosis not present

## 2021-06-29 DIAGNOSIS — Z79899 Other long term (current) drug therapy: Secondary | ICD-10-CM | POA: Diagnosis not present

## 2021-06-29 DIAGNOSIS — G894 Chronic pain syndrome: Secondary | ICD-10-CM | POA: Diagnosis not present

## 2021-06-29 DIAGNOSIS — M545 Low back pain, unspecified: Secondary | ICD-10-CM | POA: Diagnosis not present

## 2021-06-29 DIAGNOSIS — G8929 Other chronic pain: Secondary | ICD-10-CM | POA: Diagnosis not present

## 2021-07-03 DIAGNOSIS — Z79899 Other long term (current) drug therapy: Secondary | ICD-10-CM | POA: Diagnosis not present

## 2021-07-12 DIAGNOSIS — R413 Other amnesia: Secondary | ICD-10-CM | POA: Diagnosis not present

## 2021-07-12 DIAGNOSIS — Z8679 Personal history of other diseases of the circulatory system: Secondary | ICD-10-CM | POA: Diagnosis not present

## 2021-07-25 DIAGNOSIS — G894 Chronic pain syndrome: Secondary | ICD-10-CM | POA: Diagnosis not present

## 2021-07-25 DIAGNOSIS — G8929 Other chronic pain: Secondary | ICD-10-CM | POA: Diagnosis not present

## 2021-07-25 DIAGNOSIS — Z79899 Other long term (current) drug therapy: Secondary | ICD-10-CM | POA: Diagnosis not present

## 2021-07-25 DIAGNOSIS — M542 Cervicalgia: Secondary | ICD-10-CM | POA: Diagnosis not present

## 2021-07-25 DIAGNOSIS — M545 Low back pain, unspecified: Secondary | ICD-10-CM | POA: Diagnosis not present

## 2021-07-26 DIAGNOSIS — L03116 Cellulitis of left lower limb: Secondary | ICD-10-CM | POA: Diagnosis not present

## 2021-07-28 DIAGNOSIS — L03116 Cellulitis of left lower limb: Secondary | ICD-10-CM | POA: Diagnosis not present

## 2021-07-31 DIAGNOSIS — Z79899 Other long term (current) drug therapy: Secondary | ICD-10-CM | POA: Diagnosis not present

## 2021-08-22 DIAGNOSIS — M5137 Other intervertebral disc degeneration, lumbosacral region: Secondary | ICD-10-CM | POA: Diagnosis not present

## 2021-08-22 DIAGNOSIS — Z79899 Other long term (current) drug therapy: Secondary | ICD-10-CM | POA: Diagnosis not present

## 2021-08-22 DIAGNOSIS — M545 Low back pain, unspecified: Secondary | ICD-10-CM | POA: Diagnosis not present

## 2021-08-22 DIAGNOSIS — G894 Chronic pain syndrome: Secondary | ICD-10-CM | POA: Diagnosis not present

## 2021-08-23 DIAGNOSIS — F0150 Vascular dementia without behavioral disturbance: Secondary | ICD-10-CM | POA: Diagnosis not present

## 2021-08-23 DIAGNOSIS — I69319 Unspecified symptoms and signs involving cognitive functions following cerebral infarction: Secondary | ICD-10-CM | POA: Diagnosis not present

## 2021-08-23 DIAGNOSIS — F4321 Adjustment disorder with depressed mood: Secondary | ICD-10-CM | POA: Diagnosis not present

## 2021-09-06 DIAGNOSIS — I6782 Cerebral ischemia: Secondary | ICD-10-CM | POA: Insufficient documentation

## 2021-09-06 DIAGNOSIS — R413 Other amnesia: Secondary | ICD-10-CM | POA: Insufficient documentation

## 2021-09-06 DIAGNOSIS — F0390 Unspecified dementia without behavioral disturbance: Secondary | ICD-10-CM | POA: Insufficient documentation

## 2021-09-21 DIAGNOSIS — G894 Chronic pain syndrome: Secondary | ICD-10-CM | POA: Diagnosis not present

## 2021-09-21 DIAGNOSIS — M545 Low back pain, unspecified: Secondary | ICD-10-CM | POA: Diagnosis not present

## 2021-09-21 DIAGNOSIS — Z79899 Other long term (current) drug therapy: Secondary | ICD-10-CM | POA: Diagnosis not present

## 2021-09-21 DIAGNOSIS — M5137 Other intervertebral disc degeneration, lumbosacral region: Secondary | ICD-10-CM | POA: Diagnosis not present

## 2021-09-21 DIAGNOSIS — E871 Hypo-osmolality and hyponatremia: Secondary | ICD-10-CM | POA: Diagnosis not present

## 2021-09-25 DIAGNOSIS — Z79899 Other long term (current) drug therapy: Secondary | ICD-10-CM | POA: Diagnosis not present

## 2021-10-10 DIAGNOSIS — Z79899 Other long term (current) drug therapy: Secondary | ICD-10-CM | POA: Diagnosis not present

## 2021-10-10 DIAGNOSIS — G8929 Other chronic pain: Secondary | ICD-10-CM | POA: Diagnosis not present

## 2021-10-10 DIAGNOSIS — G894 Chronic pain syndrome: Secondary | ICD-10-CM | POA: Diagnosis not present

## 2021-10-10 DIAGNOSIS — M542 Cervicalgia: Secondary | ICD-10-CM | POA: Diagnosis not present

## 2021-10-10 DIAGNOSIS — M5137 Other intervertebral disc degeneration, lumbosacral region: Secondary | ICD-10-CM | POA: Diagnosis not present

## 2021-10-15 DIAGNOSIS — Z79899 Other long term (current) drug therapy: Secondary | ICD-10-CM | POA: Diagnosis not present

## 2021-10-16 DIAGNOSIS — E7800 Pure hypercholesterolemia, unspecified: Secondary | ICD-10-CM | POA: Diagnosis not present

## 2021-10-16 DIAGNOSIS — Z87891 Personal history of nicotine dependence: Secondary | ICD-10-CM | POA: Diagnosis not present

## 2021-10-16 DIAGNOSIS — I480 Paroxysmal atrial fibrillation: Secondary | ICD-10-CM | POA: Diagnosis not present

## 2021-10-16 DIAGNOSIS — I10 Essential (primary) hypertension: Secondary | ICD-10-CM | POA: Diagnosis not present

## 2021-10-16 DIAGNOSIS — F330 Major depressive disorder, recurrent, mild: Secondary | ICD-10-CM | POA: Diagnosis not present

## 2021-10-16 DIAGNOSIS — R7303 Prediabetes: Secondary | ICD-10-CM | POA: Diagnosis not present

## 2021-10-16 DIAGNOSIS — J438 Other emphysema: Secondary | ICD-10-CM | POA: Diagnosis not present

## 2021-10-16 DIAGNOSIS — I2510 Atherosclerotic heart disease of native coronary artery without angina pectoris: Secondary | ICD-10-CM | POA: Diagnosis not present

## 2021-10-16 DIAGNOSIS — J984 Other disorders of lung: Secondary | ICD-10-CM | POA: Diagnosis not present

## 2021-10-20 DIAGNOSIS — Z79899 Other long term (current) drug therapy: Secondary | ICD-10-CM | POA: Diagnosis not present

## 2021-10-20 DIAGNOSIS — M5137 Other intervertebral disc degeneration, lumbosacral region: Secondary | ICD-10-CM | POA: Diagnosis not present

## 2021-10-20 DIAGNOSIS — G8929 Other chronic pain: Secondary | ICD-10-CM | POA: Diagnosis not present

## 2021-10-20 DIAGNOSIS — M542 Cervicalgia: Secondary | ICD-10-CM | POA: Diagnosis not present

## 2021-10-20 DIAGNOSIS — G894 Chronic pain syndrome: Secondary | ICD-10-CM | POA: Diagnosis not present

## 2021-10-23 DIAGNOSIS — Z79899 Other long term (current) drug therapy: Secondary | ICD-10-CM | POA: Diagnosis not present

## 2021-11-01 DIAGNOSIS — F0390 Unspecified dementia without behavioral disturbance: Secondary | ICD-10-CM | POA: Diagnosis not present

## 2021-11-01 DIAGNOSIS — F1090 Alcohol use, unspecified, uncomplicated: Secondary | ICD-10-CM | POA: Diagnosis not present

## 2021-11-09 DIAGNOSIS — J449 Chronic obstructive pulmonary disease, unspecified: Secondary | ICD-10-CM | POA: Diagnosis not present

## 2021-11-09 DIAGNOSIS — Z79899 Other long term (current) drug therapy: Secondary | ICD-10-CM | POA: Diagnosis not present

## 2022-02-01 DIAGNOSIS — W57XXXA Bitten or stung by nonvenomous insect and other nonvenomous arthropods, initial encounter: Secondary | ICD-10-CM | POA: Insufficient documentation

## 2023-03-11 ENCOUNTER — Other Ambulatory Visit: Payer: Self-pay

## 2023-03-11 DIAGNOSIS — R319 Hematuria, unspecified: Secondary | ICD-10-CM | POA: Insufficient documentation

## 2023-03-11 DIAGNOSIS — K635 Polyp of colon: Secondary | ICD-10-CM | POA: Insufficient documentation

## 2023-03-11 DIAGNOSIS — M545 Low back pain, unspecified: Secondary | ICD-10-CM | POA: Insufficient documentation

## 2023-03-11 DIAGNOSIS — K529 Noninfective gastroenteritis and colitis, unspecified: Secondary | ICD-10-CM | POA: Insufficient documentation

## 2023-03-11 DIAGNOSIS — C679 Malignant neoplasm of bladder, unspecified: Secondary | ICD-10-CM | POA: Insufficient documentation

## 2023-03-11 DIAGNOSIS — Z720 Tobacco use: Secondary | ICD-10-CM | POA: Insufficient documentation

## 2023-03-11 DIAGNOSIS — M1990 Unspecified osteoarthritis, unspecified site: Secondary | ICD-10-CM | POA: Insufficient documentation

## 2023-03-11 DIAGNOSIS — K4090 Unilateral inguinal hernia, without obstruction or gangrene, not specified as recurrent: Secondary | ICD-10-CM | POA: Insufficient documentation

## 2023-03-11 DIAGNOSIS — J309 Allergic rhinitis, unspecified: Secondary | ICD-10-CM | POA: Insufficient documentation

## 2023-03-11 DIAGNOSIS — M5136 Other intervertebral disc degeneration, lumbar region: Secondary | ICD-10-CM | POA: Insufficient documentation

## 2023-03-11 DIAGNOSIS — Z6825 Body mass index (BMI) 25.0-25.9, adult: Secondary | ICD-10-CM | POA: Insufficient documentation

## 2023-03-17 ENCOUNTER — Ambulatory Visit: Payer: PPO | Attending: Cardiology | Admitting: Cardiology

## 2023-03-25 ENCOUNTER — Encounter: Payer: Self-pay | Admitting: Cardiology
